# Patient Record
Sex: Male | Born: 1970 | Race: White | Hispanic: No | Marital: Married | Smoking: Former smoker
Health system: Southern US, Community
[De-identification: ages and names within clinical notes are randomized; demographics above are authoritative.]

## PROBLEM LIST (undated history)

## (undated) DIAGNOSIS — C61 Malignant neoplasm of prostate: Secondary | ICD-10-CM

## (undated) DIAGNOSIS — Z800 Family history of malignant neoplasm of digestive organs: Secondary | ICD-10-CM

## (undated) HISTORY — DX: Family history of malignant neoplasm of digestive organs: Z80.0

---

## 1999-03-26 ENCOUNTER — Emergency Department (HOSPITAL_COMMUNITY): Admission: EM | Admit: 1999-03-26 | Discharge: 1999-03-26 | Payer: Self-pay | Admitting: Emergency Medicine

## 2016-05-23 DIAGNOSIS — J069 Acute upper respiratory infection, unspecified: Secondary | ICD-10-CM | POA: Diagnosis not present

## 2016-09-23 DIAGNOSIS — Z8349 Family history of other endocrine, nutritional and metabolic diseases: Secondary | ICD-10-CM | POA: Diagnosis not present

## 2016-09-23 DIAGNOSIS — Z0000 Encounter for general adult medical examination without abnormal findings: Secondary | ICD-10-CM | POA: Diagnosis not present

## 2016-09-23 DIAGNOSIS — Z13220 Encounter for screening for lipoid disorders: Secondary | ICD-10-CM | POA: Diagnosis not present

## 2016-09-23 DIAGNOSIS — Z131 Encounter for screening for diabetes mellitus: Secondary | ICD-10-CM | POA: Diagnosis not present

## 2017-09-28 DIAGNOSIS — Z0000 Encounter for general adult medical examination without abnormal findings: Secondary | ICD-10-CM | POA: Diagnosis not present

## 2017-09-28 DIAGNOSIS — E785 Hyperlipidemia, unspecified: Secondary | ICD-10-CM | POA: Diagnosis not present

## 2017-09-28 DIAGNOSIS — Z23 Encounter for immunization: Secondary | ICD-10-CM | POA: Diagnosis not present

## 2018-01-05 DIAGNOSIS — R309 Painful micturition, unspecified: Secondary | ICD-10-CM | POA: Diagnosis not present

## 2018-01-05 DIAGNOSIS — Z125 Encounter for screening for malignant neoplasm of prostate: Secondary | ICD-10-CM | POA: Diagnosis not present

## 2018-01-10 DIAGNOSIS — R972 Elevated prostate specific antigen [PSA]: Secondary | ICD-10-CM | POA: Diagnosis not present

## 2018-02-01 DIAGNOSIS — R972 Elevated prostate specific antigen [PSA]: Secondary | ICD-10-CM | POA: Diagnosis not present

## 2018-02-01 HISTORY — PX: TRANSRECTAL ULTRASOUND: SHX5146

## 2018-02-01 HISTORY — PX: PROSTATE BIOPSY: SHX241

## 2018-02-08 DIAGNOSIS — C61 Malignant neoplasm of prostate: Secondary | ICD-10-CM | POA: Diagnosis not present

## 2018-02-09 ENCOUNTER — Other Ambulatory Visit (HOSPITAL_COMMUNITY): Payer: Self-pay | Admitting: Urology

## 2018-02-09 DIAGNOSIS — C61 Malignant neoplasm of prostate: Secondary | ICD-10-CM

## 2018-02-23 ENCOUNTER — Encounter (HOSPITAL_COMMUNITY)
Admission: RE | Admit: 2018-02-23 | Discharge: 2018-02-23 | Disposition: A | Discharge status: Home / Self Care | Payer: BLUE CROSS/BLUE SHIELD | Source: Ambulatory Visit | Attending: Urology | Admitting: Urology

## 2018-02-23 DIAGNOSIS — C61 Malignant neoplasm of prostate: Secondary | ICD-10-CM

## 2018-02-23 DIAGNOSIS — N200 Calculus of kidney: Secondary | ICD-10-CM | POA: Diagnosis not present

## 2018-02-23 MED ORDER — TECHNETIUM TC 99M MEDRONATE IV KIT
21.2000 | PACK | Freq: Once | INTRAVENOUS | Status: AC | PRN
  Administered 2018-02-23: 21.2 via INTRAVENOUS

## 2018-03-01 ENCOUNTER — Telehealth: Payer: Self-pay | Admitting: Genetic Counselor

## 2018-03-01 ENCOUNTER — Encounter: Payer: Self-pay | Admitting: Genetic Counselor

## 2018-03-01 DIAGNOSIS — C61 Malignant neoplasm of prostate: Secondary | ICD-10-CM | POA: Diagnosis not present

## 2018-03-01 DIAGNOSIS — Z5111 Encounter for antineoplastic chemotherapy: Secondary | ICD-10-CM | POA: Diagnosis not present

## 2018-03-01 NOTE — Telephone Encounter (Signed)
Appt has been scheduled for the pt to see Santiago Glad for genetic counseling on 5/15 at 2pm. Letter mailed to the pt.

## 2018-03-06 ENCOUNTER — Telehealth: Payer: Self-pay | Admitting: Medical Oncology

## 2018-03-06 NOTE — Telephone Encounter (Signed)
I called pt to introduce myself as the Prostate Nurse Navigator and the Coordinator of the Prostate [place].  1. I confirmed with the patient he is aware of his referral to the clinic 03/14/18 arriving at 12:30pm.   2. I discussed the format of the clinic and the physicians he will be seeing that day.   3. I discussed where the clinic is located and how to contact me.  4. I confirmed his address and informed him I would be mailing a packet of information and forms to be completed. I asked him to bring them with him the day of his appointment.   He voiced understanding of the above. I asked him to call me if he has any questions or concerns regarding his appointments or the forms he needs to complete.

## 2018-03-07 ENCOUNTER — Encounter: Payer: Self-pay | Admitting: Medical Oncology

## 2018-03-13 ENCOUNTER — Encounter: Payer: Self-pay | Admitting: Medical Oncology

## 2018-03-13 ENCOUNTER — Encounter: Payer: Self-pay | Admitting: Radiation Oncology

## 2018-03-13 ENCOUNTER — Telehealth: Payer: Self-pay | Admitting: Medical Oncology

## 2018-03-13 DIAGNOSIS — C61 Malignant neoplasm of prostate: Secondary | ICD-10-CM | POA: Insufficient documentation

## 2018-03-13 NOTE — Telephone Encounter (Signed)
Spoke with Frank [name] to confirm appointment for [facility] arriving at 12:30pm. I reviewed location, registration, valet parking and asked him to have lunch before arrival. I also reminded him to bring his completed medical forms. He voiced understanding.

## 2018-03-13 NOTE — Progress Notes (Signed)
GU Location of Tumor / Histology: Adenocarcinoma of the prostate  If Prostate Cancer, Gleason Score is (5 + 5) and PSA is (27.88)  Frank [name] presented about 2 months ago with signs/symptoms of:  Bilateral firmness of the prostate examined by urologist Dr. Karsten Ro.  Biopsies:    Past/Anticipated interventions by urology, if any:  02/01/2018: Transrectal Ultrasound and prostate biopsy 02/23/2018: CT Abdomen and pelvis without contrast  Past/Anticipated interventions by medical oncology, if any: no  Weight changes, if any: none  Bowel/Bladder complaints, if any: Nocturia x 2-3, voiding easier since Firmagon, dribbling urine   Nausea/Vomiting, if any: no  Pain issues, if any:  yes, low back  SAFETY ISSUES:  Prior radiation? no  Pacemaker/ICD? no  Possible current pregnancy? No  Is the patient on methotrexate? no  Current Complaints / other details:  47 year old male. Married with two children.

## 2018-03-14 ENCOUNTER — Encounter: Payer: Self-pay | Admitting: Medical Oncology

## 2018-03-14 ENCOUNTER — Encounter: Payer: Self-pay | Admitting: General Practice

## 2018-03-14 ENCOUNTER — Ambulatory Visit
Admission: RE | Admit: 2018-03-14 | Discharge: 2018-03-14 | Disposition: A | Discharge status: Home / Self Care | Payer: BLUE CROSS/BLUE SHIELD | Source: Ambulatory Visit | Attending: Radiation Oncology | Admitting: Radiation Oncology

## 2018-03-14 ENCOUNTER — Other Ambulatory Visit: Payer: Self-pay

## 2018-03-14 ENCOUNTER — Telehealth: Payer: Self-pay | Admitting: Pharmacist

## 2018-03-14 ENCOUNTER — Telehealth: Payer: Self-pay | Admitting: Pharmacy Technician

## 2018-03-14 ENCOUNTER — Encounter: Payer: Self-pay | Admitting: Radiation Oncology

## 2018-03-14 ENCOUNTER — Inpatient Hospital Stay: Payer: BLUE CROSS/BLUE SHIELD | Attending: Oncology | Admitting: Oncology

## 2018-03-14 VITALS — BP 133/79 | HR 90 | Temp 97.7°F | Resp 16 | Ht 69.0 in | Wt 179.8 lb

## 2018-03-14 DIAGNOSIS — C786 Secondary malignant neoplasm of retroperitoneum and peritoneum: Secondary | ICD-10-CM

## 2018-03-14 DIAGNOSIS — Z87891 Personal history of nicotine dependence: Secondary | ICD-10-CM | POA: Insufficient documentation

## 2018-03-14 DIAGNOSIS — C7951 Secondary malignant neoplasm of bone: Secondary | ICD-10-CM | POA: Diagnosis not present

## 2018-03-14 DIAGNOSIS — C61 Malignant neoplasm of prostate: Secondary | ICD-10-CM | POA: Insufficient documentation

## 2018-03-14 DIAGNOSIS — Z79899 Other long term (current) drug therapy: Secondary | ICD-10-CM | POA: Diagnosis not present

## 2018-03-14 DIAGNOSIS — Z191 Hormone sensitive malignancy status: Secondary | ICD-10-CM | POA: Diagnosis not present

## 2018-03-14 DIAGNOSIS — Z7952 Long term (current) use of systemic steroids: Secondary | ICD-10-CM | POA: Diagnosis not present

## 2018-03-14 DIAGNOSIS — R972 Elevated prostate specific antigen [PSA]: Secondary | ICD-10-CM | POA: Diagnosis not present

## 2018-03-14 HISTORY — DX: Malignant neoplasm of prostate: C61

## 2018-03-14 MED ORDER — PREDNISONE 5 MG PO TABS: 5.0000 mg | ORAL_TABLET | Freq: Every day | ORAL | 90 refills | Status: DC

## 2018-03-14 MED ORDER — ABIRATERONE ACETATE 250 MG PO TABS: 1000.0000 mg | ORAL_TABLET | Freq: Every day | ORAL | 0 refills | Status: DC

## 2018-03-14 NOTE — Telephone Encounter (Signed)
Oral Oncology Patient Advocate Encounter  Received notification from [facility] that prior authorization for Frank [name] is required.  PA submitted on CoverMyMeds Key [id number] Status is pending  Oral Oncology Clinic will continue to follow.  Frank [name]. Melynda Keller, [place] Patient [place] [phone] 03/14/2018 2:39 PM

## 2018-03-14 NOTE — Progress Notes (Signed)
                               Care Plan Summary  Name: [name] DOB: 09/20/1971   Your Medical Team:   Urologist -  Dr. Raynelle Bring, [facility]  Radiation Oncologist - Dr. Tyler Pita, [facility]   Medical Oncologist - Dr. [name], [place]  Recommendations: 1) Continue androgen deprivation (hormone injections) 2) Zytiga (abiraterone)  3) Genetics  * These recommendations are based on information available as of today's consult.      Recommendations may change depending on the results of further tests or exams.  Next Steps: 1) Androgen deprivation (hormone injections) with Dr. Karsten Ro  2) Fabio Asa (abiraterone) Jessie-oral chemo pharmacist will be in touch with you regarding insurance and delivery of drug 3) Genetic appointment 04/05/18 at [facility]  When appointments need to be scheduled, you will be contacted by [facility] and/or [facility].  Patient provided with business cards for all team members and a copy of "[place] Prevention Safety Sheet".  Zytiga drug information given to patient.  Questions?  Please do not hesitate to call Cira Rue, RN, BSN, OCN at [phone] any questions or concerns.  Shirlean Mylar is your Oncology Nurse Navigator and is available to assist you while you're receiving your medical care at [facility].

## 2018-03-14 NOTE — Telephone Encounter (Signed)
Oral Oncology Patient Advocate Encounter  Prior Authorization for Frank [name] has been approved.    Effective dates: 03/14/2018 through 03/13/2019  Oral Oncology Clinic will continue to follow.   Frank [name]. Melynda Keller, [place] Patient [place] [phone] 03/14/2018 2:55 PM

## 2018-03-14 NOTE — Telephone Encounter (Signed)
Oral Oncology Pharmacist Encounter  Received new prescription for Zytiga (abiraterone) for the treatment of newly diagnosed, advanced, possibly metastatic, hormone-sensitive prostate cancer in conjunction with prednisone and androgen deprivation therapy, planned duration until disease progression or unacceptable toxicity.  Per MD note, patient started on Firmagon 2 weeks ago. [facility] [address]  No labs in Epic to assess, wil be checked at next office visit.  Current medication list in Epic reviewed, no DDIs with Zytiga identified.  Prescription will be sent to appropriate specialty pharmacy once insurance authorization is obtained. [facility] is currently out of network for patient's prescription insurance coverage.  Oral Oncology Clinic will continue to follow for insurance authorization, copayment issues, initial counseling and start date.  Johny Drilling, PharmD, BCPS, BCOP 03/14/2018 2:36 PM Oral Oncology Clinic [phone]

## 2018-03-14 NOTE — Progress Notes (Signed)
Reason for Referral: Prostate cancer.  HPI: 47 year old gentleman currently of [place] where he lived the majority of his life.  He is a rather healthy gentleman without any significant comorbid conditions started developing lower urinary tract symptoms starting with incomplete bladder emptying and subsequently hematuria.  He was evaluated by his primary care physician initially and his PSA was 27.88.  Based on that finding he was referred to Dr. Karsten Ro at [facility] urology.  Biopsy obtained at that time of his prostate showed high-grade and high volume disease in his prostate with a Gleason score 5+ 5 = in one core and 5+4 = 9 in the remaining 11 cores.  Based on these findings he underwent staging CT scan including CT scan of the abdomen and pelvis which showed abdominal and pelvic adenopathy anterior to the aorta on the periaortic lymph node as well as the iliac chains as well as a bulky left-sided pelvic adenopathy.  His bone scan did show questionable T10 and left rib involvement.  Based on these findings, he was started on Firmagon by Dr. Karsten Ro 2 weeks ago.  After the start of androgen deprivation, his symptoms improved from a urination standpoint including complete emptying.  He denies any nocturia or dysuria.  He denies any constitutional symptoms of weight loss or appetite changes.  Continues to be active and attends to activities of daily living.  He does not report any headaches, blurry vision, syncope or seizures. Does not report any fevers, chills or sweats.  Does not report any cough, wheezing or hemoptysis.  Does not report any chest pain, palpitation, orthopnea or leg edema.  Does not report any nausea, vomiting or abdominal pain.  Does not report any constipation or diarrhea.  Does not report any skeletal complaints.    Does not report frequency, urgency or hematuria.  Does not report any skin rashes or lesions. Does not report any heat or cold intolerance.  Does not report any  lymphadenopathy or petechiae.  Does not report any anxiety or depression.  Remaining review of systems is negative.    No past medical history on file.:  Past Surgical History:  Procedure Laterality Date  . PROSTATE BIOPSY  02/01/2018  . TRANSRECTAL ULTRASOUND  02/01/2018  :   Current Outpatient Medications:  .  abiraterone acetate (ZYTIGA) 250 MG tablet, Take 4 tablets (1,000 mg total) by mouth daily. Take on an empty stomach 1 hour before or 2 hours after a meal, Disp: 120 tablet, Rfl: 0 .  predniSONE (DELTASONE) 5 MG tablet, Take 1 tablet (5 mg total) by mouth daily with breakfast., Disp: 30 tablet, Rfl: 90:  Not on File:  Family History  Problem Relation Age of Onset  . Cancer Neg Hx   :  Social History   Socioeconomic History  . Marital status: Married    Spouse name: Bryson Ha  . Number of children: 2  . Years of education: Not on file  . Highest education level: Not on file  Occupational History  . Not on file  Social Needs  . Financial resource strain: Not on file  . Food insecurity:    Worry: Not on file    Inability: Not on file  . Transportation needs:    Medical: Not on file    Non-medical: Not on file  Tobacco Use  . Smoking status: Former Smoker    Last attempt to quit: 1996    Years since quitting: 23.3  Substance and Sexual Activity  . Alcohol use: Not on file  .  Drug use: Not on file  . Sexual activity: Not on file  Lifestyle  . Physical activity:    Days per week: Not on file    Minutes per session: Not on file  . Stress: Not on file  Relationships  . Social connections:    Talks on phone: Not on file    Gets together: Not on file    Attends religious service: Not on file    Active member of club or organization: Not on file    Attends meetings of clubs or organizations: Not on file    Relationship status: Not on file  . Intimate partner violence:    Fear of current or ex partner: Not on file    Emotionally abused: Not on file     Physically abused: Not on file    Forced sexual activity: Not on file  Other Topics Concern  . Not on file  Social History Narrative   Married with 2 children. Self-employed.  :   Exam: ECOG 0 General appearance: alert and cooperative appeared without distress. Head: atraumatic without any abnormalities. Eyes: conjunctivae/corneas clear. PERRL.  Sclera anicteric. Throat: lips, mucosa, and tongue normal; without oral thrush or ulcers. Resp: clear to auscultation bilaterally without rhonchi, wheezes or dullness to percussion. Cardio: regular rate and rhythm, S1, S2 normal, no murmur, click, rub or gallop GI: soft, non-tender; bowel sounds normal; no masses,  no organomegaly Skin: Skin color, texture, turgor normal. No rashes or lesions Lymph nodes: Cervical, supraclavicular, and axillary nodes normal. Neurologic: Grossly normal without any motor, sensory or deep tendon reflexes. Musculoskeletal: No joint deformity or effusion.     Nm Bone Scan Whole Body  Result Date: 02/23/2018 CLINICAL DATA:  Prostate cancer.  Elevated PSA equal 27 EXAM: NUCLEAR MEDICINE WHOLE BODY BONE SCAN TECHNIQUE: Whole body anterior and posterior images were obtained approximately 3 hours after intravenous injection of radiopharmaceutical. RADIOPHARMACEUTICALS:  21.2 mCi Technetium-55m MDP IV COMPARISON:  None. FINDINGS: Long segment (approximately 3 cm) of activity associated with the posterior LEFT eighth rib is in a pattern typical of metastatic disease. Focal lesion at T10 posteriorly is also in a pattern suggestive of metastasis. Probable neural arch on the LEFT at T10. Indeterminate lesion at the skull vertex IMPRESSION: 1. Two skeletal lesions most consistent with prostate cancer metastasis within the LEFT eighth rib and T10 vertebral body. 2. Indeterminate lesion at skull vertex. Electronically Signed   By: Suzy Bouchard M.D.   On: 02/23/2018 17:10    Assessment and Plan:   47 year old man with the  following issues:  1.  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  His Gleason score is 5+5 = 10 and a PSA of 27.88.  He has documented disease with pelvic and retroperitoneal adenopathy and no visceral metastasis.  His bone scan showed a very limited disease potentially in an T10 and left eighth rib.  The natural course of this disease and treatment options were discussed today in detail.  He understands he has an incurable malignancy but a disease that can be palliated and treated effectively for an extended period of time.  The backbone of treating this disease is long-term androgen deprivation therapy which she is already started.  Long-term complication associated with this therapy was discussed and he will likely switch to Lupron in the near future.  The role of adding additional therapy in the form of androgen synthesis inhibitors or chemotherapy for hormone sensitive prostate cancer was discussed today in detail.  The studies that led to the approval of these drugs including improvement in overall survival as well as progression free survival was reviewed.  The rationale and the side effects associated with these treatments were reviewed today.  Complication associated with Zytiga were discussed which include nausea, fatigue, hypertension, edema, hypokalemia, adrenal insufficiency among others were reviewed.  Complication associated with chemotherapy which includes nausea, fatigue, myelosuppression and neutropenic sepsis among others.  After discussion today, we have elected to proceed with Zytiga 1000 mg daily and prednisone 5 mg daily in the immediate future.  2.  Androgen depravation: He will continue to receive that indefinitely and currently on Firmagon received at [facility].  3. Bone directed therapy: He has very limited bone disease but would be a good candidate for Xgeva or Zometa in the future.  This will continue to be addressed with him in the  future.  4.  Lower urinary tract symptoms: Improved with androgen deprivation therapy.  No additional therapy is needed at this time for local disease control.  Radiation therapy can be an option for that in the future.  5.  Prognosis: This was discussed in details today.  He has an incurable malignancy but a disease that can be palliated for an extended period of time and given his health and performance status and aggressive therapy is warranted.  6.  Genetic counseling: Referral to genetics has been made and appointments already set up.  7.  Follow-up: We will be in the next 4-6 weeks to follow his progress.  60  minutes was spent with the patient face-to-face today.  More than 50% of time was dedicated to patient counseling, education and coordination of the his multifaceted care.

## 2018-03-14 NOTE — Progress Notes (Signed)
Radiation Oncology         ([phone]) [phone] ________________________________  Multidisciplinary Prostate Cancer Clinic  Initial Radiation Oncology Consultation  Name: Frank [name] MRN: [id number]  Date: 03/14/2018  DOB: 07-20-1971  CC:[name], MD  Raynelle Bring, MD   REFERRING PHYSICIAN: Raynelle Bring, MD  DIAGNOSIS: 47 y.o. gentleman with stage T2c N1 M1 adenocarcinoma of the prostate with a Gleason's score of 5+5 and a PSA of 27.88 - Stage IV    ICD-10-CM   1. Malignant neoplasm of prostate ([place]) C61     HISTORY OF PRESENT ILLNESS::Frank [name] is a 47 y.o. gentleman.  He was noted to have an elevated PSA of 27.88 when seen by his primary care physician, Dr. Orland Mustard on 01/05/18 for worsening LUTS and hematuria.  Accordingly, he was referred for evaluation in urology to Dr. Karsten Ro, where a digital rectal examination was performed at that time revealing firmness bilaterally.  The patient proceeded to transrectal ultrasound with 12 biopsies of the prostate on 02/01/18.  The prostate volume measured 53 cc.  Out of 12 core biopsies, 12 were positive.  The maximum Gleason score was 5+5, and this was seen in the left apex, while the remainder of the 11 samples showed Gleason's 5+4 with extraprostatic extension and perineural invasion present.   CT Pelvis on 02/23/18 showed extensive adenopathy from the pelvis to the upper abdomen with neither lung nor bone metastases.    Bone scan 02/23/18 shows two possible bone mets at T10 and left 8th rib.  The patient reviewed the biopsy results with his urologist and he has kindly been referred today to the multidisciplinary prostate cancer clinic for presentation of pathology and radiology studies in our conference for discussion of potential radiation treatment options and clinical evaluation.   On review of systems, the patient reports urinating has improved since his Firmagon injection on 03/01/2018. He reports fatigue. He reports lower  back pain but not in the T10 location.   PREVIOUS RADIATION THERAPY: No  PAST MEDICAL HISTORY:  has no past medical history on file.    PAST SURGICAL HISTORY: Past Surgical History:  Procedure Laterality Date  . PROSTATE BIOPSY  02/01/2018  . TRANSRECTAL ULTRASOUND  02/01/2018    FAMILY HISTORY: family history is not on file.  SOCIAL HISTORY:  reports that he quit smoking about 23 years ago. He does not have any smokeless tobacco history on file.  ALLERGIES: Patient has no allergy information on record.  MEDICATIONS:  No current outpatient medications on file.   No current facility-administered medications for this encounter.     REVIEW OF SYSTEMS:  A 15 point review of systems is documented in the electronic medical record. This was obtained by the nursing staff. However, I reviewed this with the patient to discuss relevant findings and make appropriate changes.  Pertinent items noted in HPI and remainder of comprehensive ROS otherwise negative.  The patient completed an IPSS and IIEF questionnaire.  His IPSS score was 10-11 indicating moderate urinary outflow obstructive symptoms of frequency, urgency, and nocturia x2-3.  He indicated that his erectile function is able to complete sexual activity most times.   PHYSICAL EXAM: This patient is in no acute distress.  He is alert and oriented.   vitals were not taken for this visit.  He exhibits no respiratory distress or labored breathing.  He appears neurologically intact.  His mood is pleasant.  His affect is appropriate.  Please note the digital rectal exam findings described above.  KPS = 90  100 - Normal; no complaints; no evidence of disease. 90   - Able to carry on normal activity; minor signs or symptoms of disease. 80   - Normal activity with effort; some signs or symptoms of disease. 73   - Cares for self; unable to carry on normal activity or to do active work. 60   - Requires occasional assistance, but is able to care for  most of his personal needs. 50   - Requires considerable assistance and frequent medical care. 15   - Disabled; requires special care and assistance. 6   - Severely disabled; hospital admission is indicated although death not imminent. 75   - Very sick; hospital admission necessary; active supportive treatment necessary. 10   - Moribund; fatal processes progressing rapidly. 0     - Dead  Karnofsky DA, Abelmann WH, Craver LS and Burchenal JH [phone]) The use of the nitrogen mustards in the palliative treatment of carcinoma: with particular reference to bronchogenic carcinoma Cancer 1 634-56   LABORATORY DATA:  No results found for: WBC, HGB, HCT, MCV, PLT No results found for: NA, K, CL, CO2 No results found for: ALT, AST, GGT, ALKPHOS, BILITOT   RADIOGRAPHY: Nm Bone Scan Whole Body  Result Date: 02/23/2018 CLINICAL DATA:  Prostate cancer.  Elevated PSA equal 27 EXAM: NUCLEAR MEDICINE WHOLE BODY BONE SCAN TECHNIQUE: Whole body anterior and posterior images were obtained approximately 3 hours after intravenous injection of radiopharmaceutical. RADIOPHARMACEUTICALS:  21.2 mCi Technetium-33m MDP IV COMPARISON:  None. FINDINGS: Long segment (approximately 3 cm) of activity associated with the posterior LEFT eighth rib is in a pattern typical of metastatic disease. Focal lesion at T10 posteriorly is also in a pattern suggestive of metastasis. Probable neural arch on the LEFT at T10. Indeterminate lesion at the skull vertex IMPRESSION: 1. Two skeletal lesions most consistent with prostate cancer metastasis within the LEFT eighth rib and T10 vertebral body. 2. Indeterminate lesion at skull vertex. Electronically Signed   By: Suzy Bouchard M.D.   On: 02/23/2018 17:10      IMPRESSION: This gentleman is a 47 y.o. gentleman with stage T2c N1 M1 adenocarcinoma of the prostate with a Gleason's score of 5+5 and a PSA of 27.88 - Stage IV.  His primary treatment at this point is likely to be systemic in nature.   However, there may be some benefit associated with treatment of the prostate.   PLAN: I talked to the patient about the role of radiation treatment in the management of prostate cancer. It can be used to palliate pain from bone metastases and alleviate urinary symptoms from prostate involvement. At this time, I do not see a role for radiation treatment and will follow up as needed.   I spent time face to face with the patient and more than 50% of that time was spent in counseling and/or coordination of care.   ------------------------------------------------  Sheral Apley. Tammi Klippel, M.D.  This document serves as a record of services personally performed by Tyler Pita, MD. It was created on his behalf by Rae Lips, a trained medical scribe. The creation of this record is based on the scribe's personal observations and the provider's statements to them. This document has been checked and approved by the attending provider.

## 2018-03-14 NOTE — Telephone Encounter (Signed)
Oral Oncology Pharmacist Encounter  [facility]iga prescription E scribed to [facility] (phone: [phone]). [facility] outpatient pharmacy is not in network for patient's prescription insurance coverage.  We will plan to see the patient today in the office after multidisciplinary prostate clinic. We will provide an update on dispensing pharmacy and offer for initial counseling on Zytiga at that time.  Oral Oncology Clinic will continue to follow.  Johny Drilling, PharmD, BCPS, BCOP 03/14/2018 2:43 PM Oral Oncology Clinic [phone]

## 2018-03-15 ENCOUNTER — Telehealth: Payer: Self-pay | Admitting: Oncology

## 2018-03-15 ENCOUNTER — Telehealth: Payer: Self-pay

## 2018-03-15 NOTE — Telephone Encounter (Signed)
Per 4/23 no los 

## 2018-03-15 NOTE — Telephone Encounter (Signed)
Oral Chemotherapy Pharmacist Encounter   I spoke with patient on 03/14/18 for overview of: Zytiga.   Pt is doing well. Counseled patient on administration, dosing, side effects, monitoring, drug-food interactions, safe handling, storage, and disposal.  Patient will take Zytiga 250mg  tablets, 4 tablets (1000mg ) by mouth once daily on an empty stomach, 1 hour before or 2 hours after a meal.  Patient states he will take his Zyitga first thing in the morning and will wait at least 1 hour before eating.  Patient will take prednisone 5mg  tablet, 1 tablet by mouth one daily with breakfast. Patient understands that prednisone will be started the same day he starts his Zytiga.  Patient informed [facility] [address] He will go ahead and pick that up so that he has it is in anticipation of delivery of Zytiga.  Zytiga start date: TBD, pending medication acquisition  Side effects include but not limited to: peripheral edema, GI upset, hypertension, hot flashes, fatigue, and arthralgias.    Reviewed with patient importance of keeping a medication schedule and plan for any missed doses.  Mr. Popelka voiced understanding and appreciation.   Medication reconciliation performed and medication/allergy list updated.  Patient updated about [facility] as dispensing pharmacy, and provided phone number: [phone] Patient will contact the pharmacy on Thursday, 03/16/2018 if he has not heard from them Patient also provided phone number to oral oncology clinic  Patient with multiple questions about androgen deprivation therapy injection options and all other oral options for adjunctive treatment. All questions answered  Patient knows to call the office with questions or concerns.  Oral Oncology Clinic will continue to follow.  Thank you,  Johny Drilling, PharmD, BCPS, BCOP 03/15/2018 8:19 AM Oral Oncology Clinic [phone]

## 2018-03-15 NOTE — Telephone Encounter (Signed)
Called pt re appts that were added - spoke w/ pt re appts that were added.

## 2018-03-16 ENCOUNTER — Encounter: Payer: Self-pay | Admitting: General Practice

## 2018-03-16 NOTE — Progress Notes (Signed)
[place] Psychosocial Distress Screening Spiritual Care  Met with Frank [name] and wife Frank [name] in [facility] to introduce [place] team/resources, reviewing distress screen per protocol.  The patient scored a 9 on the Psychosocial Distress Thermometer which indicates severe distress. Also assessed for distress and other psychosocial needs.   ONCBCN DISTRESS SCREENING 03/16/2018  Screening Type Initial Screening  Distress experienced in past week (1-10) 9  Practical problem type Work/school  Emotional problem type Depression;Nervousness/Anxiety;Adjusting to illness  Spiritual/Religous concerns type Facing my mortality  Information Concerns Type Lack of info about diagnosis;Lack of info about treatment  Physical Problem type Sleep/insomnia;Changes in urination  Referral to support programs Yes   Mr Repetto describes himself as "very competitive" and likely to poise himself as "wanting to beat this thing"--which is very hard in the face of an incurable dx. Couple has two sons (7, completing first year in college; 57, at home, has ASD). Another recent trauma has been the very sudden death of their beloved dog, who was a big source of comfort for pt.  Talked in detail with couple about challenges, common feelings, and support resources. They found normalization of feelings very helpful. Encouraged them to attend Prostate Cancer Support Group to build community. They are very familiar with [facility] following support for sons after their cousin died unexpectedly in his teens; they will consider utilizing it again for support in processing serious illness.   Follow up needed: Yes.  Couple welcomes f/u support and knows that [facility] is available in person and by phone. Plan to send handwritten note of encouragement and follow by phone/in person when they are on campus, but please also page if immediate needs arise or circumstances change. Thank you.   [place],  [place], [facility] Pager [phone] Voicemail [phone]

## 2018-03-16 NOTE — Progress Notes (Signed)
[place] Spiritual Care Note  Mailed Derold and wife Bryson Ha f/u handwritten note of encouragement and reminder of ongoing availability for support.   [place], [place], [facility] Pager [phone] Voicemail [phone]

## 2018-03-17 NOTE — Addendum Note (Signed)
Encounter addended by: Tyler Pita, MD on: 03/17/2018 5:55 PM  Actions taken: LOS modified

## 2018-03-21 ENCOUNTER — Encounter: Payer: Self-pay | Admitting: Medical Oncology

## 2018-03-21 ENCOUNTER — Telehealth: Payer: Self-pay | Admitting: Medical Oncology

## 2018-03-21 NOTE — Progress Notes (Signed)
Spoke with Emily-pharmacist to discuss patient's concerns with the cost of Zytiga. She will do some research and reach out to patient.

## 2018-03-21 NOTE — Telephone Encounter (Signed)
Oral Oncology Patient Advocate Encounter  Received communication from [name] that Frank [name] initial fill of Frank [name] was shipped to his home on 03/20/2018.   Frank [name]. Melynda Keller, [place] Patient [place] [phone] 03/21/2018 8:34 AM

## 2018-03-21 NOTE — Telephone Encounter (Signed)
Spoke with patient to follow up post [facility]. He states that he is doing well but has concerns about the cost of Zytiga. He spoke with his drug company and his will receive co=payment assistance for the first two months and then he will have to pay $5300. If he can take the generic it will cost $1500.00. He is asking if there are any other programs to help him with cost. I will as  the oral chemo pharmacist to contact him. He voiced his appreciation in this matter.

## 2018-03-22 ENCOUNTER — Telehealth: Payer: Self-pay | Admitting: Pharmacy Technician

## 2018-03-22 NOTE — Telephone Encounter (Signed)
Oral Oncology Patient Advocate Encounter  Received a call from Cira Rue that the patient had contacted her with some concerns about the copayments for his Zytiga.    [facility] has been able to dispense the medication with assistance from a manufacturer copay card.  The copay card reduces the copay to $10 for a 65-month supply.  The patient's copay for branded Zytiga (without the copay card) is in excess of $5300.00 monthly.  This copay card has an annual maximum benefit of $12,000.  Given this limit, the copay card will only provide assistance for 2 months of therapy.   Generic Zytiga (abiraterone) is available to be dispensed, but also carries a high copayment of over $1500 monthly.  There are no copay cards available for generic products.    There are no grant foundations that provide copay assistance to NON-medicare patients.    I have contacted [employer] and requested their assistance in obtaining deductible information and formulary tiering structure for this patient's prescription drug plan.  A deductible requirement may be the reason for the higher than average commercial copayments.    I have left a message for the patient to discuss these details.  I will continue to follow up with any resolution.   [name], [place] Patient [place] [phone] 03/22/2018 1:31 PM

## 2018-03-31 DIAGNOSIS — C61 Malignant neoplasm of prostate: Secondary | ICD-10-CM | POA: Diagnosis not present

## 2018-03-31 DIAGNOSIS — Z5111 Encounter for antineoplastic chemotherapy: Secondary | ICD-10-CM | POA: Diagnosis not present

## 2018-04-05 ENCOUNTER — Other Ambulatory Visit: Payer: Self-pay | Admitting: *Deleted

## 2018-04-05 ENCOUNTER — Inpatient Hospital Stay: Payer: BLUE CROSS/BLUE SHIELD | Attending: Oncology | Admitting: Genetic Counselor

## 2018-04-05 ENCOUNTER — Encounter: Payer: Self-pay | Admitting: Genetic Counselor

## 2018-04-05 ENCOUNTER — Inpatient Hospital Stay: Payer: BLUE CROSS/BLUE SHIELD

## 2018-04-05 DIAGNOSIS — C61 Malignant neoplasm of prostate: Secondary | ICD-10-CM

## 2018-04-05 DIAGNOSIS — Z7183 Encounter for nonprocreative genetic counseling: Secondary | ICD-10-CM | POA: Diagnosis not present

## 2018-04-05 DIAGNOSIS — C7951 Secondary malignant neoplasm of bone: Secondary | ICD-10-CM | POA: Insufficient documentation

## 2018-04-05 DIAGNOSIS — Z800 Family history of malignant neoplasm of digestive organs: Secondary | ICD-10-CM | POA: Diagnosis not present

## 2018-04-05 DIAGNOSIS — R351 Nocturia: Secondary | ICD-10-CM | POA: Insufficient documentation

## 2018-04-05 DIAGNOSIS — G4700 Insomnia, unspecified: Secondary | ICD-10-CM | POA: Insufficient documentation

## 2018-04-05 NOTE — Progress Notes (Signed)
REFERRING PROVIDER: Wyatt Portela, MD [address] [place], [place] [address]  PRIMARY PROVIDER:  [name], MD  PRIMARY REASON FOR VISIT:  1. Malignant neoplasm of prostate ([place])   2. Family history of colon cancer      HISTORY OF PRESENT ILLNESS:   Frank [name], a 47 y.o. male, was seen for a [facility] cancer genetics consultation at the request of Dr. Alen Blew due to a personal and family history of cancer.  Frank [name] presents to clinic today to discuss the possibility of a hereditary predisposition to cancer, genetic testing, and to further clarify his future cancer risks, as well as potential cancer risks for family members.   In March 2019, at the age of 4, Frank [name] was diagnosed with prostate cancer. This is being treated with Zytiga and other oral medications.  The patient's Gleason Score is 5+5-10.  Further evaluation found that he has a lesion on his rib.    CANCER HISTORY:   No history exists.    Past Medical History:  Diagnosis Date  . Family history of colon cancer   . Prostate cancer [facility])     Past Surgical History:  Procedure Laterality Date  . PROSTATE BIOPSY  02/01/2018  . TRANSRECTAL ULTRASOUND  02/01/2018    Social History   Socioeconomic History  . Marital status: Married    Spouse name: Bryson Ha  . Number of children: 2  . Years of education: Not on file  . Highest education level: Not on file  Occupational History    Employer: [employer]    Comment: owns a Biomedical engineer business  Social Needs  . Financial resource strain: Not on file  . Food insecurity:    Worry: Not on file    Inability: Not on file  . Transportation needs:    Medical: Not on file    Non-medical: Not on file  Tobacco Use  . Smoking status: Former Smoker    Packs/day: 0.25    Years: 12.00    Pack years: 3.00    Types: Cigarettes    Last attempt to quit: 1996    Years since quitting: 23.3  . Smokeless tobacco: Never Used   Substance and Sexual Activity  . Alcohol use: Never    Frequency: Never  . Drug use: Never  . Sexual activity: Yes  Lifestyle  . Physical activity:    Days per week: Not on file    Minutes per session: Not on file  . Stress: Not on file  Relationships  . Social connections:    Talks on phone: Not on file    Gets together: Not on file    Attends religious service: Not on file    Active member of club or organization: Not on file    Attends meetings of clubs or organizations: Not on file    Relationship status: Not on file  Other Topics Concern  . Not on file  Social History Narrative   Married with 2 children. Self-employed.      FAMILY HISTORY:  We obtained a detailed, 4-generation family history.  Significant diagnoses are listed below: Family History  Problem Relation Age of Onset  . Colon cancer Father 7  . Hemochromatosis Father   . Liver cancer Paternal Aunt   . Hemochromatosis Paternal Aunt   . Heart attack Maternal Grandmother   . Cirrhosis Maternal Grandfather   . Dementia Paternal Grandmother   . Heart attack Paternal Grandfather   .  Hemochromatosis Paternal Grandfather   . Cancer Neg Hx    The patient has a full sister and paternal half sister who are both cancer free.  He has two sons who are cancer free.  The patient's parents are both living.    The patient's mother is 37 and does not have cancer.  She was an only child.  Her parents are deceased from non cancer related issues.    The patient's father had colon cancer at 54 and ha been diagnosed with hemochromatosis.  He has four sisters and two brothers.  One sister had hemochromatosis and developed liver cancer.  His parents are deceased.  His father had hemochromatosis.  Frank [name] is unaware of previous family history of genetic testing for hereditary cancer risks. Patient's ancestors are of [place] descent. There is no reported Ashkenazi Jewish ancestry. There is no known  consanguinity.  GENETIC COUNSELING ASSESSMENT: Frank [name] is a 47 y.o. male with a personal and family history of cancer which is somewhat suggestive of a hereditary cancer syndrome and predisposition to cancer. We, therefore, discussed and recommended the following at today's visit.   DISCUSSION: We discussed that about 15-20% of prostate cancer is hereditary with most cases due to BRCA mutations.  Other genes are associated with an increased risk for prostate cancer as well.  Some of these genes, if positive, could change systemic therapy.  This includes PARP inhibitors for positive BRCA, or BRCA pathway genes, as well as potential immunotherapy with [name] syndrome.  We reviewed the characteristics, features and inheritance patterns of hereditary cancer syndromes. We also discussed genetic testing, including the appropriate family members to test, the process of testing, insurance coverage and turn-around-time for results. We discussed the implications of a negative, positive and/or variant of uncertain significant result. We recommended Frank [name] pursue genetic testing for the common hereditary cancer gene panel. APC, ATM, AXIN2, BARD1, BMPR1A, BRCA1, BRCA2, BRIP1, CDH1, CDK4, CDKN2A, CHEK2, DICER1, EPCAM, GREM1, KIT, MEN1, MLH1, MSH2, MSH6, MUTYH, NBN, NF1, PALB2, PDGFRA, PMS2, POLD1, POLE, PTEN, RAD50, RAD51C, RAD51D, SDHA, SDHB, SDHC, SDHD, SMAD4, SMARCA4. STK11, TP53, TSC1, TSC2, and VHL.     Based on Frank [name] personal and family history of cancer, he meets medical criteria for genetic testing. Despite that he meets criteria, he may still have an out of pocket cost. We discussed that if his out of pocket cost for testing is over $100, the laboratory will call and confirm whether he wants to proceed with testing.  If the out of pocket cost of testing is less than $100 he will be billed by the genetic testing laboratory.   PLAN: After considering the risks, benefits, and limitations,  Frank [name]  provided informed consent to pursue genetic testing and the blood sample was sent to [facility] for analysis of the common hereditary cancer panel. Results should be available within approximately 2-3 weeks' time, at which point they will be disclosed by telephone to Frank [name], as will any additional recommendations warranted by these results. Frank [name] will receive a summary of his genetic counseling visit and a copy of his results once available. This information will also be available in Epic. We encouraged Frank [name] to remain in contact with cancer genetics annually so that we can continuously update the family history and inform him of any changes in cancer genetics and testing that may be of benefit for his family. Frank [name] questions were answered to his satisfaction today. Our contact information was provided  should additional questions or concerns arise.  Lastly, we encouraged Frank [name] to remain in contact with cancer genetics annually so that we can continuously update the family history and inform him of any changes in cancer genetics and testing that may be of benefit for this family.   Mr.  [name] questions were answered to his satisfaction today. Our contact information was provided should additional questions or concerns arise. Thank you for the referral and allowing [place] to share in the care of your patient.   Karen P. Florene Glen, [place], [facility] Certified Genetic Counselor [email or web address] phone: [phone]  The patient was seen for a total of 45 minutes in face-to-face genetic counseling.  This patient was discussed with Drs. Magrinat, Lindi Adie and/or Burr Medico who agrees with the above.    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

## 2018-04-10 ENCOUNTER — Other Ambulatory Visit: Payer: Self-pay | Admitting: *Deleted

## 2018-04-10 DIAGNOSIS — C61 Malignant neoplasm of prostate: Secondary | ICD-10-CM

## 2018-04-10 MED ORDER — ABIRATERONE ACETATE 250 MG PO TABS: 1000.0000 mg | ORAL_TABLET | Freq: Every day | ORAL | 0 refills | Status: DC

## 2018-04-14 NOTE — Telephone Encounter (Signed)
Oral Oncology Patient Advocate Encounter  Spoke with Mr. Kronberg today about the copayment concerns for Zytiga.    We will begin the process to apply for manufacturer assistance from [place] and [place].  We have made arrangements to meet on Tuesday 04/18/18 when he is here for his appointment with Dr. Alen Blew.  He is bringing his income documents with him.    [name], [facility] Patient [place] [phone] 04/14/2018 2:46 PM

## 2018-04-18 ENCOUNTER — Telehealth: Payer: Self-pay | Admitting: Oncology

## 2018-04-18 ENCOUNTER — Inpatient Hospital Stay: Payer: BLUE CROSS/BLUE SHIELD

## 2018-04-18 ENCOUNTER — Inpatient Hospital Stay (HOSPITAL_BASED_OUTPATIENT_CLINIC_OR_DEPARTMENT_OTHER): Payer: BLUE CROSS/BLUE SHIELD | Admitting: Oncology

## 2018-04-18 VITALS — BP 138/84 | HR 83 | Temp 98.2°F | Resp 18 | Ht 69.0 in | Wt 184.4 lb

## 2018-04-18 DIAGNOSIS — R351 Nocturia: Secondary | ICD-10-CM

## 2018-04-18 DIAGNOSIS — C7951 Secondary malignant neoplasm of bone: Secondary | ICD-10-CM

## 2018-04-18 DIAGNOSIS — C61 Malignant neoplasm of prostate: Secondary | ICD-10-CM | POA: Diagnosis not present

## 2018-04-18 DIAGNOSIS — G4700 Insomnia, unspecified: Secondary | ICD-10-CM | POA: Diagnosis not present

## 2018-04-18 LAB — CMP (CANCER CENTER ONLY)
ALBUMIN: 4.4 g/dL (ref 3.5–5.0)
ALT: 30 U/L (ref 0–55)
ANION GAP: 10 (ref 3–11)
AST: 21 U/L (ref 5–34)
Alkaline Phosphatase: 87 U/L (ref 40–150)
BUN: 14 mg/dL (ref 7–26)
CHLORIDE: 107 mmol/L (ref 98–109)
CO2: 24 mmol/L (ref 22–29)
Calcium: 9.4 mg/dL (ref 8.4–10.4)
Creatinine: 0.89 mg/dL (ref 0.70–1.30)
GFR, Estimated: >60 mL/min (ref >60)
Glucose, Bld: 112 mg/dL (ref 70–140)
POTASSIUM: 3.8 mmol/L (ref 3.5–5.1)
Sodium: 141 mmol/L (ref 136–145)
Total Bilirubin: 0.3 mg/dL (ref 0.2–1.2)
Total Protein: 7.6 g/dL (ref 6.4–8.3)

## 2018-04-18 LAB — CBC WITH DIFFERENTIAL (CANCER CENTER ONLY)
BASOS PCT: 1 %
Basophils Absolute: 0 10*3/uL (ref 0.0–0.1)
Eosinophils Absolute: 0.1 10*3/uL (ref 0.0–0.5)
Eosinophils Relative: 2 %
HEMATOCRIT: 40.9 % (ref 38.4–49.9)
HEMOGLOBIN: 13.8 g/dL (ref 13.0–17.1)
LYMPHS ABS: 1.3 10*3/uL (ref 0.9–3.3)
LYMPHS PCT: 20 %
MCH: 29.8 pg (ref 27.2–33.4)
MCHC: 33.7 g/dL (ref 32.0–36.0)
MCV: 88.3 fL (ref 79.3–98.0)
Monocytes Absolute: 0.3 10*3/uL (ref 0.1–0.9)
Monocytes Relative: 5 %
NEUTROS ABS: 4.9 10*3/uL (ref 1.5–6.5)
NEUTROS PCT: 72 %
Platelet Count: 234 10*3/uL (ref 140–400)
RBC: 4.64 MIL/uL (ref 4.20–5.82)
RDW: 13.1 % (ref 11.0–14.6)
WBC Count: 6.8 10*3/uL (ref 4.0–10.3)

## 2018-04-18 MED ORDER — CALCIUM CARBONATE 600 MG PO TABS: 600.0000 mg | ORAL_TABLET | Freq: Two times a day (BID) | ORAL | 3 refills | Status: DC

## 2018-04-18 NOTE — Progress Notes (Signed)
Hematology and Oncology Follow Up Visit  Frank [name] [id number] [birth year] 47 y.o. 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  His Gleason score is 5+5 = 10 and PSA of 27.88 diagnosed March 2019.   Prior Therapy:  He is status post prostate biopsy March 2019 which confirmed the diagnosis of Gleason score 5+5 = 10 prostate cancer.  Current therapy:  Androgen deprivation started with [place] under the care of Dr. Jeffie Pollock.  Zytiga 1000 mg prednisone 5 mg daily in April 2019.  Interim History: Frank [name] presents today for a follow-up visit.  Since last visit, he started taking Zytiga without any major complications.  He was able to get the first month of therapy with very limited co-pay although his co-pay moving forward will be rather excessive.  He denies any nausea, edema or excessive fatigue.  He does report mild fatigue that resolved spontaneously.  He denies any bone pain or pathological fractures.  He continues to perform activities of daily living without any decline in ability to do so.  He denies any urination issues including incontinence or dysuria.  He does report insomnia related to nocturia.  He does not report any headaches, blurry vision, syncope or seizures. Does not report any fevers, chills or sweats.  Does not report any cough, wheezing or hemoptysis.  Does not report any chest pain, palpitation, orthopnea or leg edema.  Does not report any nausea, vomiting or abdominal pain.  Does not report any constipation or diarrhea.  Does not report any skeletal complaints.    Does not report frequency, urgency or hematuria.  Does not report any skin rashes or lesions. Does not report any heat or cold intolerance.  Does not report any lymphadenopathy or petechiae.  Does not report any anxiety or depression.  Remaining review of  systems is negative.    Medications: I have reviewed the patient's current medications.  Current Outpatient Medications  Medication Sig Dispense Refill  . abiraterone acetate (ZYTIGA) 250 MG tablet Take 4 tablets (1,000 mg total) by mouth daily. Take on an empty stomach 1 hour before or 2 hours after a meal 120 tablet 0  . lansoprazole (PREVACID) 15 MG capsule Take 15 mg by mouth daily at 12 noon.    . predniSONE (DELTASONE) 5 MG tablet Take 1 tablet (5 mg total) by mouth daily with breakfast. (Patient not taking: Reported on 03/14/2018) 30 tablet 90  . tadalafil (CIALIS) 10 MG tablet Take 10 mg by mouth daily.  99  . valACYclovir (VALTREX) 500 MG tablet Take 500 mg by mouth 2 (two) times daily.     No current facility-administered medications for this visit.      Allergies: No Known Allergies  Past Medical History, Surgical history, Social history, and Family History were reviewed and updated.    Physical Exam:  Blood pressure 138/84, pulse 83, temperature 98.2 F (36.8 C), temperature source Oral, resp. rate 18, height 5\' 9"  (1.753 m), weight 184 lb 6.4 oz (83.6 kg), SpO2 99 %.   ECOG: 0 General appearance: alert and cooperative appeared without distress. Head: Normocephalic, without obvious abnormality Oropharynx: No oral thrush or ulcers. Eyes: No scleral icterus.  Pupils are equal and round reactive to light. Lymph nodes: Cervical, supraclavicular, and axillary nodes normal. Heart:regular rate and rhythm, S1, S2 normal, no murmur, click, rub or gallop Lung:chest clear, no wheezing, rales, normal symmetric air  entry Abdomin: soft, non-tender, without masses or organomegaly. Neurological: No motor, sensory deficits.  Intact deep tendon reflexes. Skin: No rashes or lesions.  No ecchymosis or petechiae. Musculoskeletal: No joint deformity or effusion. Psychiatric: Mood and affect are appropriate.    Impression and Plan:   47 year old man with the:  1.   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    He is currently on Zytiga and prednisone started in April 2019.  He has no complications related to this medication at this time.  Risks and benefits of continuing this treatment was reviewed today and is agreeable to continue.  His PSA is currently pending and I anticipate improvement after starting androgen deprivation and Zytiga.  2.  Androgen depravation: Risks and benefits of continuing this therapy indefinitely was reviewed today.  He is currently receiving Firmagon under the care of Dr. Jeffie Pollock.  Anticipate switching to Lupron in the future.  3. Bone directed therapy: He is at risk of developing osteoporosis in addition to pathological bone fractures.  His risk is low however given his limited bone disease.  We have discussed the strategies to improve that including calcium and vitamin D supplements.  We also reviewed Xgeva and Prolia as a potential options in the future.  4.  Lower urinary tract symptoms: Symptoms have improved at this time except for nocturia.  This is contributing to some mild insomnia.  We have discussed strategies to improve that including decreasing his fluid intake at nighttime.  5.  Prognosis: His disease is incurable but can be palliated for extended period of time.  He has excellent performance status and aggressive therapy is warranted.  6.  Genetic counseling: Genetic counseling has been completed.  Laboratory testing are pending at this point.  7.  Follow-up: We will be in 6 weeks.  25 minutes was spent with the patient face-to-face today.  More than 50% of time was dedicated to patient counseling, education and coordination of the his multifaceted care.     [name], MD 5/28/20193:42 PM

## 2018-04-18 NOTE — Telephone Encounter (Signed)
Appointments scheduled AVS/Calendar printed per 5/28 los °

## 2018-04-19 ENCOUNTER — Telehealth: Payer: Self-pay | Admitting: *Deleted

## 2018-04-19 LAB — PROSTATE-SPECIFIC AG, SERUM (LABCORP)

## 2018-04-19 NOTE — Telephone Encounter (Signed)
As noted below by Dr. Shadad, I informed patient of his PSA level. He verbalized understanding.  

## 2018-04-19 NOTE — Telephone Encounter (Signed)
-----   Message from Wyatt Portela, MD sent at 04/19/2018  7:52 AM EDT ----- Please let him know his PSA is very low.

## 2018-04-20 ENCOUNTER — Telehealth: Payer: Self-pay | Admitting: Genetic Counselor

## 2018-04-20 ENCOUNTER — Ambulatory Visit: Payer: Self-pay | Admitting: Genetic Counselor

## 2018-04-20 ENCOUNTER — Encounter: Payer: Self-pay | Admitting: Genetic Counselor

## 2018-04-20 DIAGNOSIS — C61 Malignant neoplasm of prostate: Secondary | ICD-10-CM

## 2018-04-20 DIAGNOSIS — Z1379 Encounter for other screening for genetic and chromosomal anomalies: Secondary | ICD-10-CM | POA: Insufficient documentation

## 2018-04-20 DIAGNOSIS — Z800 Family history of malignant neoplasm of digestive organs: Secondary | ICD-10-CM

## 2018-04-20 NOTE — Telephone Encounter (Signed)
Revealed negative genetic testing.  Discussed that we do not know why he has prostate cancer or why there is cancer in the family. It could be due to a different gene that we are not testing, or maybe our current technology may not be able to pick something up.  It will be important for him to keep in contact with genetics to keep up with whether additional testing may be needed.  Explained that there was an ATM VUS.  This is still a normal result.   We would not change his medical management.  We will recontact him once this is reclassified.

## 2018-04-20 NOTE — Progress Notes (Addendum)
HPI:  Frank [name] was previously seen in the [facility] clinic due to a personal and family history of cancer and concerns regarding a hereditary predisposition to cancer. Please refer to our prior cancer genetics clinic note for more information regarding Frank [name] medical, social and family histories, and our assessment and recommendations, at the time. Frank [name] recent genetic test results were disclosed to him, as were recommendations warranted by these results. These results and recommendations are discussed in more detail below.  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  The Hereditary Gene Panel offered by [facility] includes sequencing and/or deletion duplication testing of the following 47 genes: APC, ATM, AXIN2, BARD1, BMPR1A, BRCA1, BRCA2, BRIP1, CDH1, CDK4, CDKN2A (p14ARF), CDKN2A (p16INK4a), CHEK2, CTNNA1, DICER1, EPCAM (Deletion/duplication testing only), GREM1 (promoter region deletion/duplication testing only), KIT, MEN1, MLH1, MSH2, MSH3, MSH6, MUTYH, NBN, NF1, NHTL1, PALB2, PDGFRA, PMS2, POLD1, POLE, PTEN, RAD50, RAD51C, RAD51D, SDHB, SDHC, SDHD, SMAD4, SMARCA4. STK11, TP53, TSC1, TSC2, and VHL.  The following genes were evaluated for sequence changes only: SDHA and HOXB13 c.251G>A variant only. The report date is Apr 20, 2018.       FAMILY HISTORY:  We obtained a detailed, 4-generation family history.  Significant diagnoses are listed below: Family History  Problem Relation Age of Onset  . Colon cancer Father 45  . Hemochromatosis Father   . Liver cancer Paternal Aunt   . Hemochromatosis Paternal Aunt   . Heart attack Maternal Grandmother   . Cirrhosis Maternal Grandfather   . Dementia Paternal Grandmother   . Heart attack Paternal Grandfather   .  Hemochromatosis Paternal Grandfather   . Cancer Neg Hx     The patient has a full sister and paternal half sister who are both cancer free.  He has two sons who are cancer free.  The patient's parents are both living.    The patient's mother is 5 and does not have cancer.  She was an only child.  Her parents are deceased from non cancer related issues.    The patient's father had colon cancer at 44 and ha been diagnosed with hemochromatosis.  He has four sisters and two brothers.  One sister had hemochromatosis and developed liver cancer.  His parents are deceased.  His father had hemochromatosis.  Frank [name] is unaware of previous family history of genetic testing for hereditary cancer risks. Patient's ancestors are of [place] descent. There is no reported Ashkenazi Jewish ancestry. There is no known consanguinity.  GENETIC TEST RESULTS: Genetic testing reported out on Apr 20, 2018 through the common hereditary cancer panel found no deleterious mutations.  The Hereditary Gene Panel offered by [facility] includes sequencing and/or deletion duplication testing of the following 47 genes: APC, ATM, AXIN2, BARD1, BMPR1A, BRCA1, BRCA2, BRIP1, CDH1, CDK4, CDKN2A (p14ARF), CDKN2A (p16INK4a), CHEK2, CTNNA1, DICER1, EPCAM (Deletion/duplication testing only), GREM1 (promoter region deletion/duplication testing only), KIT, MEN1, MLH1, MSH2, MSH3, MSH6, MUTYH, NBN, NF1, NHTL1, PALB2, PDGFRA, PMS2, POLD1, POLE, PTEN, RAD50, RAD51C, RAD51D, SDHB, SDHC, SDHD, SMAD4, SMARCA4. STK11, TP53, TSC1, TSC2, and VHL.  The following genes were evaluated for sequence changes only: SDHA and HOXB13 c.251G>A variant only. The test report has been scanned into EPIC and is located under the Molecular Pathology section of the Results Review  tab.    We discussed with Frank [name] that since the current genetic testing is not perfect, it is possible there may be a gene mutation in one of these genes that current testing cannot  detect, but that chance is small.  We also discussed, that it is possible that another gene that has not yet been discovered, or that we have not yet tested, is responsible for the cancer diagnoses in the family, and it is, therefore, important to remain in touch with cancer genetics in the future so that we can continue to offer Frank [name] the most up to date genetic testing.   Genetic testing did detect a Variant of Unknown Significance in the ATM gene called c.4909+3G>A (Intronic). This is a variant in the Intronic region. At this time, it is unknown if this variant is associated with increased cancer risk or if this is a normal finding, but most variants such as this get reclassified to being inconsequential. It should not be used to make medical management decisions. With time, we suspect the lab will determine the significance of this variant, if any. If we do learn more about it, we will try to contact Frank [name] to discuss it further. However, it is important to stay in touch with [place] periodically and keep the address and phone number up to date.  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  The updated report date is December 17, 2020   CANCER SCREENING RECOMMENDATIONS: This result is reassuring and indicates that Frank [name] likely does not have an increased risk for a future cancer due to a mutation in one of these genes. This normal test also suggests that Frank [name] cancer was most likely not due to an inherited predisposition associated with one of these genes.  Most cancers happen by chance and this negative test suggests that his cancer falls into this category.  We, therefore, recommended he continue to follow the cancer management and screening guidelines provided by his oncology and primary healthcare provider.   An individual's cancer risk and medical management are not determined by genetic test results alone. Overall cancer risk assessment  incorporates additional factors, including personal medical history, family history, and any available genetic information that may result in a personalized plan for cancer prevention and surveillance.  RECOMMENDATIONS FOR FAMILY MEMBERS:  Women in this family might be at some increased risk of developing cancer, over the general population risk, simply due to the family history of cancer.  We recommended women in this family have a yearly mammogram beginning at age 34, or 89 years younger than the earliest onset of cancer, an annual clinical breast exam, and perform monthly breast self-exams. Women in this family should also have a gynecological exam as recommended by their primary provider. All family members should have a colonoscopy by age 59.  FOLLOW-UP: Lastly, we discussed with Frank [name] that cancer genetics is a rapidly advancing field and it is possible that new genetic tests will be appropriate for him and/or his family members in the future. We encouraged him to remain in contact with cancer genetics on an annual basis so we can update his personal and family histories and let him know of advances in cancer genetics that may benefit this family.   Our contact number was provided. Frank [name] questions were answered to his satisfaction, and he knows he is welcome to call us at anytime with additional questions or concerns.   Roma Kayser, MS, [place]  Certified [employer] [place] [email or web address]

## 2018-04-27 ENCOUNTER — Telehealth: Payer: Self-pay | Admitting: Pharmacy Technician

## 2018-04-27 NOTE — Telephone Encounter (Signed)
Oral Oncology Patient Advocate Encounter  Met patient in [facility] to complete application for [employer] Patient Assistance Foundation in an effort to reduce patient's out of pocket expense for Zytiga to $0.    Application completed and faxed to [phone].   [employer] patient assistance phone number for follow up is [phone].   His copayments, up until this point, have been supported by a copay card from the manufacturer.  Since his out of pocket costs before the copay card is applied are high ($5300 monthly) the annual benefit of the copay care are nearing exhaustion.  I have included a letter of medical necessity with the application that explains his situation.   This encounter will be updated until final determination.  Emily M. Howes CPhT, AAS Specialty Pharmacy Patient Advocate Oral Oncology Clinic - [facility] [phone] 04/27/2018 11:35 AM  

## 2018-05-02 DIAGNOSIS — R3912 Poor urinary stream: Secondary | ICD-10-CM | POA: Diagnosis not present

## 2018-05-02 DIAGNOSIS — C7951 Secondary malignant neoplasm of bone: Secondary | ICD-10-CM | POA: Diagnosis not present

## 2018-05-02 DIAGNOSIS — C61 Malignant neoplasm of prostate: Secondary | ICD-10-CM | POA: Diagnosis not present

## 2018-05-08 NOTE — Telephone Encounter (Signed)
Oral Oncology Pharmacist Encounter  Received call from patient with questions about status of [employer] application.  I called [employer] at [phone] for status check. Representative related information that current tax information is required for continued processing of patient's application. When application was submitted by the office it was submitted with income documentation from 2017.  I called patient with request for this information. Patient states he will get in touch with his account today and provide requested income documentation.  Once this information is received from patient it will be faxed to [place] [employer] for continued processing of patient's application.  Patient states he has 8 days of Zytiga remaining. We discussed that if he is to end up with a break in therapy that he should stop his prednisone during his treatment break from his Zytiga as well. Both agents will be restarted at the same time once we get continued supply of Zytiga to patient.   Oral Oncology Clinic will continue to follow.  Johny Drilling, PharmD, BCPS, BCOP  05/08/2018 9:52 AM Oral Oncology Clinic [phone]

## 2018-05-09 NOTE — Telephone Encounter (Signed)
Oral Oncology Pharmacist Encounter  Received W-2's from patient for himself and his wife for 2018. These updated income docs have been faxed to [place] at [phone]. We will follow up with [employer] later today to ensure application continues to be processed. Patient has 7 days of Zytiga remaining on hand.  Oral Oncology Clinic will continue to follow.  Johny Drilling, PharmD, BCPS, BCOP  05/09/2018 7:59 AM Oral Oncology Clinic [phone]

## 2018-05-09 NOTE — Telephone Encounter (Signed)
Oral Oncology Patient Advocate Encounter  Followed up with [employer] to make sure that documents sent yesterday had been received.  I was informed that they also needed a "form [phone]" that shows that an extension had been filed for the filing of the 2018 tax return.    [name] is going to contact his accountant and have that form emailed to him.  He will then forward to the oral chemo office.   [name], [name] Patient [place] [phone] 05/09/2018 3:11 PM

## 2018-05-10 ENCOUNTER — Other Ambulatory Visit: Payer: Self-pay | Admitting: *Deleted

## 2018-05-10 DIAGNOSIS — C61 Malignant neoplasm of prostate: Secondary | ICD-10-CM

## 2018-05-10 MED ORDER — ABIRATERONE ACETATE 250 MG PO TABS: 1000.0000 mg | ORAL_TABLET | Freq: Every day | ORAL | 0 refills | Status: DC

## 2018-05-11 NOTE — Telephone Encounter (Signed)
Oral Oncology Patient Advocate Encounter  Form [id number] was sent to [employer] on 05/10/2018 along with the household W2s and updated income amount on the application.    I have confirmed with [employer] that all of the information was received and the application is currently in the final reviewal process.    I will continue to follow up.   [name], [place] Patient [place] [phone] 05/11/2018 10:02 AM

## 2018-05-12 NOTE — Telephone Encounter (Signed)
Oral Oncology Patient Advocate Encounter  Continued follow up on the patient's application for assistance.  Application remains pending.  I requested, again, that the application be expedited.    [name], [place] Patient [place] [phone] 05/12/2018 9:43 AM]

## 2018-05-16 NOTE — Telephone Encounter (Signed)
Oral Oncology Pharmacist Encounter  Received notification from Lina Sar that patient has been temporarily enrolled into their compassionate use program to receive Zytiga at no out-of-pocket cost to patient through 07/14/18. Full enrollment until 11/21/18 is contingent upon receiving Medicare part D attestation form, signed, back from the patient.  I called [employer] to speak with them abut how patient had a commercial prescription coverage plan. They show patient's plan is a Medicare Plan. I do not have a copy of patient's actual insurance card to reference.  I called patient and spoke with patient to inform him about enrollment into program. Mr Azure states that he has not yet heard from [place] [employer] with this information. A letter was mailed to the patient on 05/15/18.  I explained that he will receive Zytiga from [place] of program's choosing, [facility] (ph: [phone]) and provided dispensing pharmacy's number to patient.  They will process prescription through patient's insurance coverage and then the compassionate use program will pick up any copayment that is due. Mr. Munger informed that enrollment ends on 11/21/2018. Mr. Borgerding informed that full enrollment up to this date is contingent upon [employer] receiving back Medicare part D attestation form.  Mr. Carreno will keep an eye out in the mail over the next week for this information and return to program as soon as it is received.  He will contact me back in the office week of 05/22/2018 if he has not yet received this information from program. We will arrange for patient to come to the office to sign copy that was provided to [place] so that I may fax to [place] [employer] if they have not received it.  Mr. Spickler provided phone number to [place] PAF [phone]) so that he may contact program if she has further questions about how to obtain Zytiga.  Mr. Aris Everts may end up with a small break (feww days - 1 week) in [facility] therapy. We talked  through any implications of this.  Mr. Waymire expressed understanding and appreciation. He knows to call the office with any additional questions or concerns.  Johny Drilling, PharmD, BCPS, BCOP  05/16/2018 12:15 PM Oral Oncology Clinic [phone]

## 2018-05-30 ENCOUNTER — Encounter: Payer: Self-pay | Admitting: Medical Oncology

## 2018-05-30 ENCOUNTER — Inpatient Hospital Stay: Payer: BLUE CROSS/BLUE SHIELD | Attending: Oncology

## 2018-05-30 ENCOUNTER — Telehealth: Payer: Self-pay

## 2018-05-30 ENCOUNTER — Inpatient Hospital Stay (HOSPITAL_BASED_OUTPATIENT_CLINIC_OR_DEPARTMENT_OTHER): Payer: BLUE CROSS/BLUE SHIELD | Admitting: Oncology

## 2018-05-30 VITALS — BP 119/67 | HR 77 | Temp 97.9°F | Resp 17 | Ht 69.0 in | Wt 187.8 lb

## 2018-05-30 DIAGNOSIS — C61 Malignant neoplasm of prostate: Secondary | ICD-10-CM

## 2018-05-30 DIAGNOSIS — C7951 Secondary malignant neoplasm of bone: Secondary | ICD-10-CM | POA: Insufficient documentation

## 2018-05-30 LAB — CBC WITH DIFFERENTIAL (CANCER CENTER ONLY)
BASOS PCT: 1 %
Basophils Absolute: 0 10*3/uL (ref 0.0–0.1)
EOS ABS: 0.2 10*3/uL (ref 0.0–0.5)
Eosinophils Relative: 3 %
HEMATOCRIT: 39.8 % (ref 38.4–49.9)
Hemoglobin: 13.6 g/dL (ref 13.0–17.1)
Lymphocytes Relative: 20 %
Lymphs Abs: 1.4 10*3/uL (ref 0.9–3.3)
MCH: 30.6 pg (ref 27.2–33.4)
MCHC: 34.2 g/dL (ref 32.0–36.0)
MCV: 89.4 fL (ref 79.3–98.0)
MONO ABS: 0.4 10*3/uL (ref 0.1–0.9)
MONOS PCT: 5 %
Neutro Abs: 5.2 10*3/uL (ref 1.5–6.5)
Neutrophils Relative %: 71 %
Platelet Count: 187 10*3/uL (ref 140–400)
RBC: 4.45 MIL/uL (ref 4.20–5.82)
RDW: 13.1 % (ref 11.0–14.6)
WBC Count: 7.2 10*3/uL (ref 4.0–10.3)

## 2018-05-30 LAB — CMP (CANCER CENTER ONLY)
ALBUMIN: 4.3 g/dL (ref 3.5–5.0)
ALT: 20 U/L (ref 0–44)
AST: 15 U/L (ref 15–41)
Alkaline Phosphatase: 86 U/L (ref 38–126)
Anion gap: 6 (ref 5–15)
BILIRUBIN TOTAL: 0.4 mg/dL (ref 0.3–1.2)
BUN: 12 mg/dL (ref 6–20)
CO2: 28 mmol/L (ref 22–32)
Calcium: 9.6 mg/dL (ref 8.9–10.3)
Chloride: 109 mmol/L (ref 98–111)
Creatinine: 0.81 mg/dL (ref 0.61–1.24)
GFR, Est AFR Am: >60 mL/min (ref >60)
GFR, Estimated: >60 mL/min (ref >60)
GLUCOSE: 109 mg/dL — AB (ref 70–99)
Potassium: 3.7 mmol/L (ref 3.5–5.1)
SODIUM: 143 mmol/L (ref 135–145)
Total Protein: 6.9 g/dL (ref 6.5–8.1)

## 2018-05-30 NOTE — Progress Notes (Signed)
Patient states he is beginning to adjust to side effects of Zytiga and Lupron. He has been fatigued and experiencing hot flashes but states the fatigue has improved slightly. He is interested in attending the prostate support group. I gave him the August meeting date.

## 2018-05-30 NOTE — Telephone Encounter (Signed)
Per patient requested date and time for return visit. Per 7/9 los printed avs and calender of upcoming appointment.

## 2018-05-30 NOTE — Progress Notes (Signed)
Hematology and Oncology Follow Up Visit  Frank [name] [id number] [birth year] 47 y.o. 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  He presented with with disease in the bone as well as lymphadenopathy and found to have Gleason score is 5+5 = 10 and PSA of 27.88.    Prior Therapy:  He is status post prostate biopsy March 2019 which confirmed the diagnosis of Gleason score 5+5 = 10 prostate cancer.  Current therapy:  Androgen deprivation currently receiving Lupron under the care of Dr. Jeffie Pollock.  Zytiga 1000 mg prednisone 5 mg daily in April 2019.  Interim History: Frank [name] is here for a follow-up visit.  Since last visit, he reports no major changes in his health.  He does report grade 1 fatigue and hot flashes associated with Lupron.  He also reported to decrease in his libido as well.  He denies any complications related to [facility] including abdominal pain, edema or excessive fatigue.  He is able to receive this medication without any issues.  He denies any bone pain or back pain.  He denies any pathological fractures.  He does not report any headaches, blurry vision, syncope or seizures.  He denies any alteration in mental status or confusion.  Does not report any fevers, chills or sweats.    No changes appetite or weight loss.  Does not report any cough, wheezing or hemoptysis.  Does not report any chest pain, palpitation, orthopnea or leg edema.  Does not report any nausea, vomiting or abdominal pain.  Does not report any hematochezia or melena. Does not report any arthralgias or myalgias.    Does not report frequency, urgency or hematuria.  Does not report any skin rashes or lesions. Does not report any heat or cold intolerance.  Does not report any lymphadenopathy or petechiae.  Does not report any changes in his mood.  He denies any bleeding tendencies.  Remaining review  of systems is negative.    Medications: I have reviewed the patient's current medications.  Current Outpatient Medications  Medication Sig Dispense Refill  . abiraterone acetate (ZYTIGA) 250 MG tablet Take 4 tablets (1,000 mg total) by mouth daily. Take on an empty stomach 1 hour before or 2 hours after a meal 120 tablet 0  . calcium carbonate (OS-CAL) 600 MG TABS tablet Take 1 tablet (600 mg total) by mouth 2 (two) times daily with a meal. 60 tablet 3  . lansoprazole (PREVACID) 15 MG capsule Take 15 mg by mouth daily at 12 noon.    . predniSONE (DELTASONE) 5 MG tablet Take 1 tablet (5 mg total) by mouth daily with breakfast. (Patient not taking: Reported on 03/14/2018) 30 tablet 90  . tadalafil (CIALIS) 10 MG tablet Take 10 mg by mouth daily.  99  . valACYclovir (VALTREX) 500 MG tablet Take 500 mg by mouth 2 (two) times daily.     No current facility-administered medications for this visit.      Allergies: No Known Allergies  Past Medical History, Surgical history, Social history, and Family History were reviewed and updated.    Physical Exam:  Blood pressure 119/67, pulse 77, temperature 97.9 F (36.6 C), temperature source Oral, resp. rate 17, height 5\' 9"  (1.753 m), weight 187 lb 12.8 oz (85.2 kg), SpO2 100 %.   ECOG: 0 General appearance: Well-appearing gentleman without distress.. Head: Atraumatic without abnormalities.   Oropharynx: Mucous membranes are moist and pink. Eyes:  Conjunctiva is clear without icterus. Lymph nodes: No lymphadenopathy noted in the cervical, supraclavicular or axillary nodes Heart:regular rate and rhythm, without murmurs.  No leg edema. Lung:chest clear, without any rhonchi, wheezes or dullness to percussion. Abdomin: soft, non-tender, without any shifting dullness or ascites. Neurological: No deficits noted in the motor, sensory deep tendon reflexes. Skin: No ecchymosis or petechiae. Musculoskeletal: No clubbing or cyanosis. Psychiatric:  Appropriate mood and affect.    Impression and Plan:   47 year old man with the:  1.  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  He presented with Gleason score 10 and retroperitoneal adenopathy wiith limited bone disease.    He was started on Zytiga with prednisone without complications.  His PSA continues to be undetectable without any recent issues of obtaining this medication.  The natural course of this disease was reviewed again as well as risks and benefits of continuing this treatment.  After discussion today he is agreeable to continue at this time.  Long-term complications such as hypertension, hypokalemia and adrenal insufficiency.  2.  Androgen depravation: He is currently on Lupron without any major complications.  Long-term side effects associated with this medication were reviewed again today.  These include osteoporosis, hyperlipidemia among others.  He is agreeable to continue understands that is critical to undergo androgen deprivation to treat his condition.  3. Bone directed therapy: The rationale for using Xgeva in this condition was discussed today.  Calcium and vitamin D supplements recommended and he will obtain dental clearance for consideration for Xgeva in the future.  4.  Lower urinary tract symptoms: Continues to improve at this time after the start of therapy.  Mostly have resolved.  5.  Prognosis: Was discussed again today.  He understands he is incurable malignancy but disease that is reasonably palliated.  6.  Follow-up: We will be in 7 weeks.  25 minutes was spent with the patient face-to-face today.  More than 50% of time was dedicated to patient counseling, education and answering questions about diagnosis, prognosis and future plan of care.     Frank [name], Frank [name] 7/9/20193:22 PM

## 2018-05-31 ENCOUNTER — Telehealth: Payer: Self-pay

## 2018-05-31 LAB — PROSTATE-SPECIFIC AG, SERUM (LABCORP): Prostate Specific Ag, Serum: <0.1 ng/mL (ref 0.0–4.0)

## 2018-05-31 NOTE — Telephone Encounter (Signed)
Called pt to let him know that PSA is still <0.1. Pt verbalized understanding.

## 2018-06-08 ENCOUNTER — Other Ambulatory Visit: Payer: Self-pay | Admitting: Oncology

## 2018-06-08 DIAGNOSIS — C61 Malignant neoplasm of prostate: Secondary | ICD-10-CM

## 2018-06-09 ENCOUNTER — Encounter: Payer: Self-pay | Admitting: *Deleted

## 2018-06-09 ENCOUNTER — Other Ambulatory Visit: Payer: Self-pay | Admitting: *Deleted

## 2018-06-09 DIAGNOSIS — C61 Malignant neoplasm of prostate: Secondary | ICD-10-CM

## 2018-06-09 MED ORDER — ABIRATERONE ACETATE 250 MG PO TABS: 1000.0000 mg | ORAL_TABLET | Freq: Every day | ORAL | 11 refills | Status: DC

## 2018-06-13 ENCOUNTER — Other Ambulatory Visit: Payer: Self-pay | Admitting: *Deleted

## 2018-06-13 DIAGNOSIS — C61 Malignant neoplasm of prostate: Secondary | ICD-10-CM

## 2018-06-13 MED ORDER — ABIRATERONE ACETATE 250 MG PO TABS: 1000.0000 mg | ORAL_TABLET | Freq: Every day | ORAL | 0 refills | Status: DC

## 2018-07-04 ENCOUNTER — Other Ambulatory Visit: Payer: Self-pay | Admitting: Oncology

## 2018-07-04 DIAGNOSIS — C61 Malignant neoplasm of prostate: Secondary | ICD-10-CM

## 2018-07-13 ENCOUNTER — Ambulatory Visit: Payer: BLUE CROSS/BLUE SHIELD | Admitting: Oncology

## 2018-07-21 ENCOUNTER — Telehealth: Payer: Self-pay

## 2018-07-21 ENCOUNTER — Inpatient Hospital Stay: Payer: BLUE CROSS/BLUE SHIELD | Attending: Oncology | Admitting: Oncology

## 2018-07-21 ENCOUNTER — Inpatient Hospital Stay: Payer: BLUE CROSS/BLUE SHIELD

## 2018-07-21 VITALS — BP 137/81 | HR 95 | Temp 97.8°F | Resp 18 | Ht 69.0 in | Wt 191.0 lb

## 2018-07-21 DIAGNOSIS — C61 Malignant neoplasm of prostate: Secondary | ICD-10-CM

## 2018-07-21 DIAGNOSIS — C7951 Secondary malignant neoplasm of bone: Secondary | ICD-10-CM

## 2018-07-21 LAB — CBC WITH DIFFERENTIAL (CANCER CENTER ONLY)
Basophils Absolute: 0 10*3/uL (ref 0.0–0.1)
Basophils Relative: 0 %
EOS ABS: 0.1 10*3/uL (ref 0.0–0.5)
EOS PCT: 1 %
HCT: 40.9 % (ref 38.4–49.9)
Hemoglobin: 13.9 g/dL (ref 13.0–17.1)
LYMPHS ABS: 1.2 10*3/uL (ref 0.9–3.3)
LYMPHS PCT: 19 %
MCH: 30.8 pg (ref 27.2–33.4)
MCHC: 34 g/dL (ref 32.0–36.0)
MCV: 90.7 fL (ref 79.3–98.0)
MONO ABS: 0.3 10*3/uL (ref 0.1–0.9)
MONOS PCT: 5 %
Neutro Abs: 4.5 10*3/uL (ref 1.5–6.5)
Neutrophils Relative %: 75 %
PLATELETS: 179 10*3/uL (ref 140–400)
RBC: 4.51 MIL/uL (ref 4.20–5.82)
RDW: 12.4 % (ref 11.0–14.6)
WBC: 6.1 10*3/uL (ref 4.0–10.3)

## 2018-07-21 LAB — CMP (CANCER CENTER ONLY)
ALBUMIN: 4.5 g/dL (ref 3.5–5.0)
ALT: 25 U/L (ref 0–44)
AST: 17 U/L (ref 15–41)
Alkaline Phosphatase: 97 U/L (ref 38–126)
Anion gap: 9 (ref 5–15)
BUN: 15 mg/dL (ref 6–20)
CHLORIDE: 110 mmol/L (ref 98–111)
CO2: 25 mmol/L (ref 22–32)
CREATININE: 0.85 mg/dL (ref 0.61–1.24)
Calcium: 9.8 mg/dL (ref 8.9–10.3)
GFR, Est AFR Am: >60 mL/min (ref >60)
GLUCOSE: 110 mg/dL — AB (ref 70–99)
Potassium: 4.1 mmol/L (ref 3.5–5.1)
Sodium: 144 mmol/L (ref 135–145)
Total Bilirubin: 0.4 mg/dL (ref 0.3–1.2)
Total Protein: 7.5 g/dL (ref 6.5–8.1)

## 2018-07-21 NOTE — Telephone Encounter (Signed)
Printed avs and calender of upcoming appointment. Per 8/30 los 

## 2018-07-21 NOTE — Progress Notes (Signed)
Hematology and Oncology Follow Up Visit  Frank [name] [id number] [birth year] 47 y.o. 07/21/2018 2:23 PM Frank [name], MDMorrow, Frank Lies, MD   Principle Diagnosis: 47 year old man with prostate cancer with Gleason score is 5+5 = 10 and PSA of 27.88.  He developed castration-sensitive disease to the bone as well as lymphadenopathy and found to have .    Prior Therapy:  He is status post prostate biopsy March 2019 which confirmed the diagnosis of Gleason score 5+5 = 10 prostate cancer.  Current therapy:  Androgen deprivation currently receiving Lupron under the care of Dr. Jeffie [name].  Zytiga 1000 mg prednisone 5 mg daily in April 2019.  Interim History: [name] is here for a follow-up visit.  Since the last visit, he reports no major changes in his health.  He remains active and continues to attend activities of daily living.  He tolerated Zytiga reasonably well with some mild fatigue that is manageable.  He does report some hot flashes but no bone pain or pathological fractures.  He denies any urination difficulties or recent hospitalizations.  He does not report any headaches, blurry vision, syncope or seizures.  He denies any dizziness or confusion.  Does not report any fevers, chills or sweats.    Appetite and weight remains stable.  Does not report any cough, wheezing or hemoptysis.  Does not report any chest pain, palpitation, orthopnea or leg edema.  Does not report any nausea, vomiting or abdominal pain.  He denies any change in his bowel habits. Does not report any hematochezia or melena. Does not report any bone pain or pathological fractures.    Does not report frequency, urgency or hematuria.  Does not report any skin rashes or lesions. Does not report any heat or cold intolerance.  Does not report any lymphadenopathy or petechiae.  Does not report any anxiety or depression.  He denies any skin rashes or lesions.  Remaining review of systems is negative.    Medications: I have  reviewed the patient's current medications.  Current Outpatient Medications  Medication Sig Dispense Refill  . calcium carbonate (OS-CAL) 600 MG TABS tablet Take 1 tablet (600 mg total) by mouth 2 (two) times daily with a meal. 60 tablet 3  . lansoprazole (PREVACID) 15 MG capsule Take 15 mg by mouth daily at 12 noon.    . predniSONE (DELTASONE) 5 MG tablet Take 1 tablet (5 mg total) by mouth daily with breakfast. (Patient not taking: Reported on 03/14/2018) 30 tablet 90  . tadalafil (CIALIS) 10 MG tablet Take 10 mg by mouth daily.  99  . valACYclovir (VALTREX) 500 MG tablet Take 500 mg by mouth 2 (two) times daily.    Marland Kitchen ZYTIGA 250 MG tablet TAKE 4 TABLETS BY MOUTH ONCE DAILY AS DIRECTED.  TAKE 1 HOUR BEFORE OR 2 HOURS AFTER A MEAL 120 tablet 11   No current facility-administered medications for this visit.      Allergies: No Known Allergies  Past Medical History, Surgical history, Social history, and Family History were reviewed and updated.    Physical Exam:  Blood pressure 137/81, pulse 95, temperature 97.8 F (36.6 C), temperature source Oral, resp. rate 18, height 5\' 9"  (1.753 m), weight 191 lb (86.6 kg), SpO2 99 %.  ECOG: 0   General appearance: Comfortable appearing without any discomfort Head: Normocephalic without any trauma Oropharynx: Mucous membranes are moist and pink without any thrush or ulcers. Eyes: Pupils are equal and round reactive to light. Lymph nodes: No cervical, supraclavicular, inguinal  or axillary lymphadenopathy.   Heart:regular rate and rhythm.  S1 and S2 without leg edema. Lung: Clear without any rhonchi or wheezes.  No dullness to percussion. Abdomin: Soft, nontender, nondistended with good bowel sounds.  No hepatosplenomegaly. Musculoskeletal: No joint deformity or effusion.  Full range of motion noted. Neurological: No deficits noted on motor, sensory and deep tendon reflex exam. Skin: No petechial rash or dryness.  Appeared moist.  Psychiatric:  Mood and affect appeared appropriate.   Results for Frank, [name] (MRN [id number]) as of 07/21/2018 14:42  Ref. Range 04/18/2018 15:34 05/30/2018 14:45  Prostate Specific Ag, Serum Latest Ref Range: 0.0 - 4.0 ng/mL <0.1 <0.1    Impression and Plan:   47 year old man with the:  1.    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     He remains on Zytiga with prednisone without any complications.  His PSA continues to be undetectable.  The natural course of this disease was reviewed today as well as future treatment options were reviewed.  His imaging studies from April 2019 were discussed and treatment plan was outlined.  I recommend continuing Zytiga for the time being and will repeat staging work-up in January 2020.  Different salvage therapy will be needed in the form of Taxotere chemotherapy if he develops progression of disease.  2.  Androgen depravation: He remains on Lupron which he is taking every 6 months.  He is due in December 2019 under the care of Dr. Jeffie [name] at [facility].  3. Bone directed therapy: He remains on calcium and vitamin D.  Risks and benefits of Delton See was reviewed today.  Long-term complications including osteonecrosis of the jaw and hypocalcemia was reviewed.  He has a pending dental evaluation and possible surgery and for this reason we will continue to hold off Xgeva.  4.  Lower urinary tract symptoms: Improved at this time and close to normal.  5.  Prognosis: His disease is incurable but manageable untreatable for the time being.  Aggressive measures are warranted.  6.  Follow-up: We will be in 2 months to follow his progress.  25 minutes was spent with the patient face-to-face today.  More than 50% of time was dedicated to discussing the natural course of his disease, reviewing future treatment options as well as coordinating his plan of  care.     Frank Button, MD 8/30/20192:23 PM

## 2018-07-22 LAB — PROSTATE-SPECIFIC AG, SERUM (LABCORP)

## 2018-07-25 ENCOUNTER — Telehealth: Payer: Self-pay

## 2018-07-25 NOTE — Telephone Encounter (Signed)
Contacted patient and made aware of PSA results. 

## 2018-08-24 ENCOUNTER — Telehealth: Payer: Self-pay | Admitting: Pharmacist

## 2018-08-24 NOTE — Telephone Encounter (Signed)
Oral Oncology Pharmacist Encounter  Received voicemail from patient with question about whether or not it was okay for him to receive a flu shot this year while on treatment with Zytiga and androgen deprivation therapy. Left voicemail for patient that it is a great idea to go on and get a flu shot. Left direct dial to oral oncology clinic if patient should have any additional questions.  Johny Drilling, PharmD, BCPS, BCOP  08/24/2018 9:03 AM Oral Oncology Clinic [phone]

## 2018-09-14 ENCOUNTER — Telehealth: Payer: Self-pay

## 2018-09-14 NOTE — Telephone Encounter (Signed)
Oral Oncology Patient Advocate Encounter  Frank [name] is due to be renewed before 11/21/18. I filled out a new application and placed the doctors forms to sign and give refills if desired in the folder.   I have his 2018 documentation but I will need his signature. I called the patient to make him aware of this and he stated he would sign at his appointment on 09/22/2018  I will continue to update  [place] Patient [place] Phone [phone] Fax [phone]

## 2018-09-22 ENCOUNTER — Inpatient Hospital Stay: Payer: BLUE CROSS/BLUE SHIELD | Attending: Oncology | Admitting: Oncology

## 2018-09-22 ENCOUNTER — Telehealth: Payer: Self-pay | Admitting: Oncology

## 2018-09-22 ENCOUNTER — Inpatient Hospital Stay: Payer: BLUE CROSS/BLUE SHIELD

## 2018-09-22 VITALS — BP 129/87 | HR 93 | Temp 98.0°F | Resp 18 | Ht 69.0 in | Wt 191.0 lb

## 2018-09-22 DIAGNOSIS — C61 Malignant neoplasm of prostate: Secondary | ICD-10-CM | POA: Insufficient documentation

## 2018-09-22 DIAGNOSIS — Z79899 Other long term (current) drug therapy: Secondary | ICD-10-CM

## 2018-09-22 LAB — CBC WITH DIFFERENTIAL (CANCER CENTER ONLY)
ABS IMMATURE GRANULOCYTES: 0.04 10*3/uL (ref 0.00–0.07)
BASOS ABS: 0 10*3/uL (ref 0.0–0.1)
BASOS PCT: 0 %
EOS ABS: 0.1 10*3/uL (ref 0.0–0.5)
Eosinophils Relative: 1 %
HCT: 40.7 % (ref 39.0–52.0)
Hemoglobin: 13.7 g/dL (ref 13.0–17.0)
IMMATURE GRANULOCYTES: 1 %
Lymphocytes Relative: 20 %
Lymphs Abs: 1.4 10*3/uL (ref 0.7–4.0)
MCH: 30.6 pg (ref 26.0–34.0)
MCHC: 33.7 g/dL (ref 30.0–36.0)
MCV: 91.1 fL (ref 80.0–100.0)
MONOS PCT: 5 %
Monocytes Absolute: 0.4 10*3/uL (ref 0.1–1.0)
NEUTROS ABS: 5 10*3/uL (ref 1.7–7.7)
NEUTROS PCT: 73 %
NRBC: 0 % (ref 0.0–0.2)
Platelet Count: 227 10*3/uL (ref 150–400)
RBC: 4.47 MIL/uL (ref 4.22–5.81)
RDW: 12.2 % (ref 11.5–15.5)
WBC: 6.8 10*3/uL (ref 4.0–10.5)

## 2018-09-22 LAB — CMP (CANCER CENTER ONLY)
ALBUMIN: 4.3 g/dL (ref 3.5–5.0)
ALT: 24 U/L (ref 0–44)
AST: 19 U/L (ref 15–41)
Alkaline Phosphatase: 106 U/L (ref 38–126)
Anion gap: 12 (ref 5–15)
BUN: 13 mg/dL (ref 6–20)
CHLORIDE: 109 mmol/L (ref 98–111)
CO2: 26 mmol/L (ref 22–32)
CREATININE: 0.86 mg/dL (ref 0.61–1.24)
Calcium: 9.7 mg/dL (ref 8.9–10.3)
GFR, Est AFR Am: >60 mL/min (ref >60)
Glucose, Bld: 116 mg/dL — ABNORMAL HIGH (ref 70–99)
POTASSIUM: 4 mmol/L (ref 3.5–5.1)
SODIUM: 147 mmol/L — AB (ref 135–145)
Total Bilirubin: 0.5 mg/dL (ref 0.3–1.2)
Total Protein: 7.5 g/dL (ref 6.5–8.1)

## 2018-09-22 NOTE — Telephone Encounter (Signed)
Appts scheduled avs/calendar contrast provided per 11/1 los

## 2018-09-22 NOTE — Progress Notes (Signed)
Hematology and Oncology Follow Up Visit  Frank [name] [id number] [birth year] 47 y.o. 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  He was found to have Gleason score is 5+5 = 10 and PSA of 27.88.   Prior Therapy:  He is status post prostate biopsy March 2019 which confirmed the diagnosis of Gleason score 5+5 = 10 prostate cancer.  Current therapy:  Androgen deprivation currently receiving Lupron under the care of Dr. Jeffie Pollock.  Zytiga 1000 mg prednisone 5 mg daily in April 2019.  Interim History: [name] returns today for a repeat evaluation.  Since the last visit, he reports no major changes in his health.  He continues to tolerate Zytiga with mild fatigue but otherwise no other complaints.  He denies any lower extremity edema, nausea or weight loss.  He remains active and attends to activities of daily living.  He does report some lower back discomfort at times that has not been constant.  Is not associate with any neurological deficits and does not affect his mobility.  He denies any changes in his bowels or urinary function.  His quality of life and performance status remains excellent.  He does not report any headaches, blurry vision, syncope or seizures.  He denies any lethargy or alteration of mental status.  Does not report any fevers, chills or sweats.   Does not report any cough, wheezing or hemoptysis.  Does not report any chest pain, palpitation, orthopnea or leg edema.  Does not report any nausea, vomiting or abdominal pain.  He denies any constipation or diarrhea. Does not report any hematochezia or melena. Does not report any arthralgias or myalgias.   Does not report frequency, urgency or hematuria.  Does not report any skin rashes or lesions.  Does not report any lymphadenopathy or petechiae.  Does not report any changes in mood.  No bleeding or clotting  tendency.  Remaining review of systems is negative.    Medications: I have reviewed the patient's current medications.  Current Outpatient Medications  Medication Sig Dispense Refill  . calcium carbonate (OS-CAL) 600 MG TABS tablet Take 1 tablet (600 mg total) by mouth 2 (two) times daily with a meal. 60 tablet 3  . lansoprazole (PREVACID) 15 MG capsule Take 15 mg by mouth daily at 12 noon.    . predniSONE (DELTASONE) 5 MG tablet Take 1 tablet (5 mg total) by mouth daily with breakfast. (Patient not taking: Reported on 03/14/2018) 30 tablet 90  . tadalafil (CIALIS) 10 MG tablet Take 10 mg by mouth daily.  99  . valACYclovir (VALTREX) 500 MG tablet Take 500 mg by mouth 2 (two) times daily.    Marland [name] ZYTIGA 250 MG tablet TAKE 4 TABLETS BY MOUTH ONCE DAILY AS DIRECTED.  TAKE 1 HOUR BEFORE OR 2 HOURS AFTER A MEAL 120 tablet 11   No current facility-administered medications for this visit.      Allergies: No Known Allergies  Past Medical History, Surgical history, Social history, and Family History were reviewed and updated.    Physical Exam:  Blood pressure 129/87, pulse 93, temperature 98 F (36.7 C), temperature source Oral, resp. rate 18, height 5\' 9"  (1.753 m), weight 191 lb (86.6 kg), SpO2 98 %.   ECOG: 0   General appearance: Alert, awake without any distress. Head: Atraumatic without abnormalities Oropharynx: Without any thrush or ulcers. Eyes: No scleral icterus. Lymph nodes: No lymphadenopathy noted in  the cervical, supraclavicular, or axillary nodes Heart:regular rate and rhythm, without any murmurs or gallops.   Lung: Clear to auscultation without any rhonchi, wheezes or dullness to percussion. Abdomin: Soft, nontender without any shifting dullness or ascites. Musculoskeletal: No clubbing or cyanosis. Neurological: No motor or sensory deficits.  Ambulating without any difficulties.  Intact deep tendon reflexes. Skin: No rashes or lesions. Psychiatric: Mood and affect  appeared normal.     Results for RAY, GERVASI (MRN [id number]) as of 09/22/2018 14:42  Ref. Range 05/30/2018 14:45 07/21/2018 14:07  Prostate Specific Ag, Serum Latest Ref Range: 0.0 - 4.0 ng/mL <0.1 <0.1     Impression and Plan:   47 year old man with the:  1.    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  He presented with pelvic and abdominal adenopathy.  He remains on Zytiga with excellent response to therapy and PSA that is currently undetectable.  Risks and benefits of continuing this medication was reviewed today he is agreeable to continue.  The plan is to repeat staging work-up including CT scan and bone scan.  Given his recent complaints of back pain it is important to document positive response to therapy without any new bone metastasis.  2.  Androgen depravation: I recommended continuing androgen deprivation therapy indefinitely.  He is currently receiving it every 6 months under the care of Dr. Jeffie Pollock.  3. Bone directed therapy: Delton See currently is under consideration but delayed till his dental procedures completed.  I have recommended calcium and vitamin D.  4.  Lower urinary tract symptoms: Remains stable at this time without any progression.  5.  Prognosis: Treatment remains palliative at this time but his performance that is excellent and aggressive therapy is warranted.  6.  Back pain: Appears to be mechanical in nature although cannot rule out malignancy.  Will obtain imaging studies for the next visit to confirm.  7.  Follow-up: We will be in 3 months to follow his progress.  15 minutes was spent with the patient face-to-face today.  More than 50% of time was dedicated to discussing his disease status, treatment options and coordinating plan of care.     [name], MD 11/1/20192:41 PM

## 2018-09-23 LAB — PROSTATE-SPECIFIC AG, SERUM (LABCORP)

## 2018-09-25 ENCOUNTER — Telehealth: Payer: Self-pay

## 2018-09-25 NOTE — Telephone Encounter (Signed)
-----   Message from Wyatt Portela, MD sent at 09/25/2018  8:31 AM EST ----- Please let him know his PSA is low

## 2018-09-25 NOTE — Telephone Encounter (Signed)
Contacted patient and made aware of results. 

## 2018-09-26 ENCOUNTER — Other Ambulatory Visit: Payer: BLUE CROSS/BLUE SHIELD

## 2018-09-26 ENCOUNTER — Ambulatory Visit: Payer: BLUE CROSS/BLUE SHIELD | Admitting: Oncology

## 2018-10-09 DIAGNOSIS — Z23 Encounter for immunization: Secondary | ICD-10-CM | POA: Diagnosis not present

## 2018-10-09 DIAGNOSIS — Z0000 Encounter for general adult medical examination without abnormal findings: Secondary | ICD-10-CM | POA: Diagnosis not present

## 2018-10-11 NOTE — Telephone Encounter (Signed)
Oral Oncology Patient Advocate Encounter  Wynetta Emery and Arts development officer for Fabio Asa was faxed 10/11/18.  This encounter will updated until final determination  [place] Patient [place] Phone [phone] Fax [phone]

## 2018-10-12 ENCOUNTER — Encounter: Payer: Self-pay | Admitting: Medical Oncology

## 2018-10-31 DIAGNOSIS — C778 Secondary and unspecified malignant neoplasm of lymph nodes of multiple regions: Secondary | ICD-10-CM | POA: Diagnosis not present

## 2018-10-31 DIAGNOSIS — C61 Malignant neoplasm of prostate: Secondary | ICD-10-CM | POA: Diagnosis not present

## 2018-10-31 DIAGNOSIS — C7951 Secondary malignant neoplasm of bone: Secondary | ICD-10-CM | POA: Diagnosis not present

## 2018-11-17 NOTE — Telephone Encounter (Signed)
Oral Oncology Patient Advocate Encounter  I called Frank [name] and Frank [name] to follow up on the renewal application and it has been approved 11/22/18-11/22/19.   I called the patient and left a detailed voicemail.   [place] Patient [place] Phone [phone] Fax [phone]

## 2018-12-06 ENCOUNTER — Encounter (HOSPITAL_COMMUNITY)
Admission: RE | Admit: 2018-12-06 | Discharge: 2018-12-06 | Disposition: A | Discharge status: Home / Self Care | Payer: BLUE CROSS/BLUE SHIELD | Source: Ambulatory Visit | Attending: Oncology | Admitting: Oncology

## 2018-12-06 DIAGNOSIS — C61 Malignant neoplasm of prostate: Secondary | ICD-10-CM | POA: Insufficient documentation

## 2018-12-06 MED ORDER — TECHNETIUM TC 99M MEDRONATE IV KIT
20.2000 | PACK | Freq: Once | INTRAVENOUS | Status: AC | PRN
  Administered 2018-12-06: 20.2 via INTRAVENOUS

## 2018-12-12 ENCOUNTER — Ambulatory Visit (HOSPITAL_COMMUNITY)
Admission: RE | Admit: 2018-12-12 | Discharge: 2018-12-12 | Disposition: A | Discharge status: Home / Self Care | Payer: BLUE CROSS/BLUE SHIELD | Source: Ambulatory Visit | Attending: Oncology | Admitting: Oncology

## 2018-12-12 ENCOUNTER — Encounter (HOSPITAL_COMMUNITY): Payer: Self-pay

## 2018-12-12 ENCOUNTER — Inpatient Hospital Stay: Payer: BLUE CROSS/BLUE SHIELD | Attending: Oncology

## 2018-12-12 DIAGNOSIS — Z79899 Other long term (current) drug therapy: Secondary | ICD-10-CM | POA: Insufficient documentation

## 2018-12-12 DIAGNOSIS — C61 Malignant neoplasm of prostate: Secondary | ICD-10-CM | POA: Diagnosis not present

## 2018-12-12 LAB — CMP (CANCER CENTER ONLY)
ALK PHOS: 129 U/L — AB (ref 38–126)
ALT: 31 U/L (ref 0–44)
AST: 18 U/L (ref 15–41)
Albumin: 4.5 g/dL (ref 3.5–5.0)
Anion gap: 13 (ref 5–15)
BILIRUBIN TOTAL: 0.3 mg/dL (ref 0.3–1.2)
BUN: 11 mg/dL (ref 6–20)
CALCIUM: 10 mg/dL (ref 8.9–10.3)
CO2: 23 mmol/L (ref 22–32)
CREATININE: 0.79 mg/dL (ref 0.61–1.24)
Chloride: 108 mmol/L (ref 98–111)
GFR, Estimated: >60 mL/min (ref >60)
Glucose, Bld: 98 mg/dL (ref 70–99)
Potassium: 3.8 mmol/L (ref 3.5–5.1)
Sodium: 144 mmol/L (ref 135–145)
TOTAL PROTEIN: 7.6 g/dL (ref 6.5–8.1)

## 2018-12-12 LAB — CBC WITH DIFFERENTIAL (CANCER CENTER ONLY)
ABS IMMATURE GRANULOCYTES: 0.07 10*3/uL (ref 0.00–0.07)
Basophils Absolute: 0.1 10*3/uL (ref 0.0–0.1)
Basophils Relative: 1 %
EOS PCT: 7 %
Eosinophils Absolute: 0.4 10*3/uL (ref 0.0–0.5)
HEMATOCRIT: 43.8 % (ref 39.0–52.0)
Hemoglobin: 14.8 g/dL (ref 13.0–17.0)
Immature Granulocytes: 1 %
LYMPHS PCT: 28 %
Lymphs Abs: 1.8 10*3/uL (ref 0.7–4.0)
MCH: 30.5 pg (ref 26.0–34.0)
MCHC: 33.8 g/dL (ref 30.0–36.0)
MCV: 90.1 fL (ref 80.0–100.0)
MONO ABS: 0.4 10*3/uL (ref 0.1–1.0)
MONOS PCT: 7 %
NEUTROS ABS: 3.6 10*3/uL (ref 1.7–7.7)
Neutrophils Relative %: 56 %
Platelet Count: 216 10*3/uL (ref 150–400)
RBC: 4.86 MIL/uL (ref 4.22–5.81)
RDW: 11.9 % (ref 11.5–15.5)
WBC Count: 6.3 10*3/uL (ref 4.0–10.5)
nRBC: 0 % (ref 0.0–0.2)

## 2018-12-12 MED ORDER — SODIUM CHLORIDE (PF) 0.9 % IJ SOLN
INTRAMUSCULAR | Status: AC
  Filled 2018-12-12: qty 50

## 2018-12-12 MED ORDER — IOHEXOL 300 MG/ML  SOLN
100.0000 mL | Freq: Once | INTRAMUSCULAR | Status: AC | PRN
  Administered 2018-12-12: 100 mL via INTRAVENOUS

## 2018-12-13 LAB — PROSTATE-SPECIFIC AG, SERUM (LABCORP): Prostate Specific Ag, Serum: <0.1 ng/mL (ref 0.0–4.0)

## 2018-12-14 ENCOUNTER — Encounter: Payer: Self-pay | Admitting: Medical Oncology

## 2018-12-14 ENCOUNTER — Telehealth: Payer: Self-pay | Admitting: Oncology

## 2018-12-14 ENCOUNTER — Inpatient Hospital Stay (HOSPITAL_BASED_OUTPATIENT_CLINIC_OR_DEPARTMENT_OTHER): Payer: BLUE CROSS/BLUE SHIELD | Admitting: Oncology

## 2018-12-14 VITALS — BP 130/85 | HR 94 | Temp 97.6°F | Resp 18 | Ht 69.0 in | Wt 195.3 lb

## 2018-12-14 DIAGNOSIS — C61 Malignant neoplasm of prostate: Secondary | ICD-10-CM | POA: Diagnosis not present

## 2018-12-14 DIAGNOSIS — Z79899 Other long term (current) drug therapy: Secondary | ICD-10-CM

## 2018-12-14 NOTE — Progress Notes (Signed)
Frank [name] here today as follow up and get results of his scans. He states his anxiety has been elevated waiting on scan results. He was happy to share, he got great results. He has done well with Zytiga and Lupron but is experiencing fatigue, low libido and weight gain. I encouraged him to do some type of exercise which will burn calories and help him mentally. I have encouraged him and his wife to attend the prostate support group where he will meet other men that are on the same journey. He is planning to attend in February. I asked him to call me if I can help him in any way to make this journey easier. He voiced understanding.

## 2018-12-14 NOTE — Telephone Encounter (Signed)
Scheduled appt per 01/23 los. ° °Printed avs. °

## 2018-12-14 NOTE — Progress Notes (Signed)
Hematology and Oncology Follow Up Visit  Frank [name] [id number] 03/17/1971 48 y.o. 12/14/2018 3:36 PM [name], MDMorrow, Marjory Lies, MD   Principle Diagnosis: 48 year old man with advanced prostate cancer presented with Gleason score 4+5 = 10 and a PSA of 27.8 in March 2019.  He has castration-sensitive disease.  Prior Therapy:  He is status post prostate biopsy March 2019 which confirmed the diagnosis of Gleason score 5+5 = 10 prostate cancer.  Current therapy:  Androgen deprivation currently receiving Lupron under the care of Dr. Jeffie Pollock.  Zytiga 1000 mg prednisone 5 mg daily in April 2019.  Interim History: [name] returns today for repeat evaluation.  Since last visit, he continues to take Zytiga without any major complications.  He is experiencing some mild fatigue and tiredness associated with therapy.  He also has reported some weight gain and hot flashes associated with Lupron.  He also noticed decrease in his libido.  He denies any bone pain or fractures.  His appetite and performance status is unchanged.  He did develop a mild dermatological reaction after CT scan.  He continues to work full-time without any decline ability to do so.  He does not report any headaches, blurry vision, syncope or seizures.  He denies any alteration mental status or dizziness.  Does not report any fevers, chills or sweats.   Does not report any cough, wheezing or hemoptysis.  Does not report any chest pain, palpitation, orthopnea or leg edema.  Does not report any nausea, vomiting or early satiety.  He denies any changes in bowel habits.   Does not report any bone pain or pathological fractures.  Does not report frequency, urgency or hematuria.  Does not report any skin rashes or lesions.  Does not report any ecchymosis petechiae.  Does not report any major changes in his mood.   Remaining review of systems is negative.    Medications: I have reviewed the patient's current medications.   Current Outpatient Medications  Medication Sig Dispense Refill  . calcium carbonate (OS-CAL) 600 MG TABS tablet Take 1 tablet (600 mg total) by mouth 2 (two) times daily with a meal. 60 tablet 3  . lansoprazole (PREVACID) 15 MG capsule Take 15 mg by mouth daily at 12 noon.    . predniSONE (DELTASONE) 5 MG tablet Take 1 tablet (5 mg total) by mouth daily with breakfast. (Patient not taking: Reported on 03/14/2018) 30 tablet 90  . tadalafil (CIALIS) 10 MG tablet Take 10 mg by mouth daily.  99  . valACYclovir (VALTREX) 500 MG tablet Take 500 mg by mouth 2 (two) times daily.    Marland Kitchen ZYTIGA 250 MG tablet TAKE 4 TABLETS BY MOUTH ONCE DAILY AS DIRECTED.  TAKE 1 HOUR BEFORE OR 2 HOURS AFTER A MEAL 120 tablet 11   No current facility-administered medications for this visit.      Allergies: No Known Allergies  Past Medical History, Surgical history, Social history, and Family History were reviewed and updated.    Physical Exam:  Blood pressure 130/85, pulse 94, temperature 97.6 F (36.4 C), temperature source Oral, resp. rate 18, height 5\' 9"  (1.753 m), weight 195 lb 4.8 oz (88.6 kg), SpO2 98 %.   ECOG: 0   General appearance: Alert, awake without any distress. Head: Atraumatic without abnormalities Oropharynx: Without any thrush or ulcers. Eyes: No scleral icterus. Lymph nodes: No lymphadenopathy noted in the cervical, supraclavicular, or axillary nodes Heart:regular rate and rhythm, without any murmurs or gallops.   Lung: Clear to  auscultation without any rhonchi, wheezes or dullness to percussion. Abdomin: Soft, nontender without any shifting dullness or ascites. Musculoskeletal: No clubbing or cyanosis. Neurological: No motor or sensory deficits. Skin: Very faint rash noted on his wrists and feet bilaterally. Psychiatric: Mood and affect appeared normal.   Results for Frank, [name] (MRN [id number]) as of 12/14/2018 14:32  Ref. Range 09/22/2018 14:30 12/12/2018 07:54  Prostate  Specific Ag, Serum Latest Ref Range: 0.0 - 4.0 ng/mL <0.1 <0.1   EXAM: CT ABDOMEN AND PELVIS WITH CONTRAST  TECHNIQUE: Multidetector CT imaging of the abdomen and pelvis was performed using the standard protocol following bolus administration of intravenous contrast.  CONTRAST:  170mL OMNIPAQUE IOHEXOL 300 MG/ML  SOLN  COMPARISON:  02/23/2018  FINDINGS: Lower chest: Unremarkable.  Hepatobiliary: No focal abnormality within the liver parenchyma. There is no evidence for gallstones, gallbladder wall thickening, or pericholecystic fluid. No intrahepatic or extrahepatic biliary dilation.  Pancreas: No focal mass lesion. No dilatation of the main duct. No intraparenchymal cyst. No peripancreatic edema.  Spleen: No splenomegaly. No focal mass lesion.  Adrenals/Urinary Tract: No adrenal nodule or mass. 2 mm nonobstructing stone identified upper pole left kidney. 1-2 mm nonobstructing stone is identified in the lower pole the right kidney. No evidence for hydroureter. Bladder is nondistended. Several polypoid foci of increased attenuation are seen along the posterior bladder wall (well demonstrated coronal image 64/series 6 and axial image 77 of series 2).  Stomach/Bowel: Stomach is nondistended. No gastric wall thickening. No evidence of outlet obstruction. Duodenum is normally positioned as is the ligament of Treitz. No small bowel wall thickening. No small bowel dilatation. The terminal ileum is normal. The appendix is not visualized, but there is no edema or inflammation in the region of the cecum. No gross colonic mass. No colonic wall thickening.  Vascular/Lymphatic: No abdominal aortic aneurysm. No abdominal aortic atherosclerotic calcification. There is no gastrohepatic or hepatoduodenal ligament lymphadenopathy. Upper abdominal and retroperitoneal lymphadenopathy seen previously has decreased. 18 mm short axis hepato duodenal ligament lymph nodes seen  previously has decreased to 9 mm short axis in the interval (24/2). Index preaortic node measured previously at 11 mm is no longer measurable. 11 mm short axis aortocaval lymph node measured on the prior study is 6 mm short axis today (46/2). Pelvic sidewall lymphadenopathy is decreased in the interval with 19 mm short axis left pelvic sidewall lymph node measured previously decreased in the interval to 7 mm today (68/2). 17 mm short axis left common femoral node measured previously is now 7 mm (71/2). No new or progressive lymphadenopathy in the abdomen/pelvis.  Reproductive: Prostate gland has decreased in size in the interval.  Other: No intraperitoneal free fluid.  Musculoskeletal: New sclerotic bone lesions are identified, including 9 mm sacral lesion (57/2), 8 mm posterior left iliac lesion (55/2). And 11 mm right femoral head lesion (78/2). Recent bone scan demonstrated activity in the T10 vertebral body without discrete T10 lesion evident by CT today. Ribs corresponding to the left rib activity seen on previous bone scan not included on today's CT.  IMPRESSION: 1. Marked interval decrease in abdominopelvic lymphadenopathy. 2. New small sclerotic bone lesions in the pelvis. Given the response of lymphadenopathy, this potentially could represent sclerosis of pre-existing lesions related to healing, but new bony metastatic disease is not excluded. Close attention on follow-up will be required. See above report for discussion relating to recent bone scan. 3. Tiny bilateral nonobstructing renal stones IMPRESSION: 1. New lesion in the anterior aspect of  the left sixth rib. 2. Stable lesion in the posterior aspect of the left eighth rib. 3. Diminished activity in the lesion of the T10 vertebral body Impression and Plan:   48 year old man with the:  1.    Advanced prostate cancer diagnosed in March 2019.  He he has castration-sensitive disease.Marland Kitchen  He is currently on  Zytiga with prednisone with excellent response to therapy.  His PSA remains undetectable.  CT scan and bone scan from December 06, 2018 were personally reviewed and discussed with the patient and his wife.  These imaging studies indicate overall complete response to therapy.  Risks and benefits of continuing this therapy long-term as well as future implication of this therapy as well as additional salvage options.  At this time he is agreeable to continue I will continue to monitor his PSA periodically.  2.  Androgen depravation: Continues to receive Lupron under the care of Dr. Jeffie Pollock which I recommended continuing.  3. Bone directed therapy: I recommended continuing calcium and vitamin D supplements.  Delton See will be considered once dental issues resolved  4.  Prognosis: Therapy is no active at this time although his performance status is excellent and aggressive therapy is warranted.  5.  Back pain: Resolved at this time.  6.  Follow-up: We will be in April 2020 to follow his progress.  25 minutes was spent with the patient face-to-face today.  More than 50% of time was dedicated to reviewing his disease status, imaging studies and answering questions regarding future plan of care.     [name], MD 1/23/20203:36 PM

## 2018-12-14 NOTE — Progress Notes (Signed)
Received a call from [place] with concerns about Frank [name]. She states his mood is down, he is fatigued and is worried about back pain. We discussed the side effects of androgen deprivation and discussed his back pain. I spoke with him and we discussed the side effects and his back pain. He informed me Dr. Alen Blew has ordered follow scans before the next visit to restage. He is anxious and just cannot believe he has prostate cancer at 66.  I encouraged him and his wife to attend our prostate support group here at [facility] which has men of all ages that have or are continuing to be treated. I also offered to refer him to our support services or I can ask Dr. Alen Blew to refer him outside of the cancer center. He voiced understanding and will call me. I encouraged Frank [name] to call if she feels she needs to talk as well.

## 2018-12-27 DIAGNOSIS — R05 Cough: Secondary | ICD-10-CM | POA: Diagnosis not present

## 2018-12-27 DIAGNOSIS — J988 Other specified respiratory disorders: Secondary | ICD-10-CM | POA: Diagnosis not present

## 2019-02-01 ENCOUNTER — Telehealth: Payer: Self-pay | Admitting: *Deleted

## 2019-02-01 NOTE — Telephone Encounter (Signed)
No. But he still has to bee very careful around people and avoid sick contacts.

## 2019-02-01 NOTE — Telephone Encounter (Signed)
Patient worried about coronavirus d/t taking zytiga and lupron. Per dr Alen Blew. Just avoid sick people and use good handwashing. Patient verbalized understanding.

## 2019-02-01 NOTE — Telephone Encounter (Signed)
Patient calling. States he takes zytiga,prednisone and lupron. Is he immuno compromised concerning the coronavirus?

## 2019-02-21 ENCOUNTER — Inpatient Hospital Stay: Payer: BLUE CROSS/BLUE SHIELD | Admitting: Oncology

## 2019-02-21 ENCOUNTER — Inpatient Hospital Stay: Payer: BLUE CROSS/BLUE SHIELD | Attending: Oncology

## 2019-02-22 ENCOUNTER — Telehealth: Payer: Self-pay | Admitting: Oncology

## 2019-02-22 NOTE — Telephone Encounter (Signed)
Called patient per 4/1 sch message to reschedule appt - unable to reach patient. Left message with call back number for reschedule.

## 2019-04-10 ENCOUNTER — Other Ambulatory Visit: Payer: Self-pay | Admitting: Oncology

## 2019-04-12 ENCOUNTER — Other Ambulatory Visit: Payer: Self-pay | Admitting: Oncology

## 2019-04-12 DIAGNOSIS — C61 Malignant neoplasm of prostate: Secondary | ICD-10-CM

## 2019-04-23 ENCOUNTER — Telehealth: Payer: Self-pay | Admitting: Oncology

## 2019-04-23 NOTE — Telephone Encounter (Signed)
Returned patient's phone call regarding rescheduling an appointment, lab and follow up appointment has been rescheduled from 04/01 to 06/05.   Message to provider.

## 2019-04-24 DIAGNOSIS — Z20828 Contact with and (suspected) exposure to other viral communicable diseases: Secondary | ICD-10-CM | POA: Diagnosis not present

## 2019-04-27 ENCOUNTER — Inpatient Hospital Stay: Payer: BC Managed Care – PPO

## 2019-04-27 ENCOUNTER — Other Ambulatory Visit: Payer: Self-pay

## 2019-04-27 ENCOUNTER — Inpatient Hospital Stay: Payer: BC Managed Care – PPO | Attending: Oncology | Admitting: Oncology

## 2019-04-27 VITALS — BP 134/84 | HR 94 | Temp 99.1°F | Resp 18 | Ht 69.0 in | Wt 194.8 lb

## 2019-04-27 DIAGNOSIS — C7951 Secondary malignant neoplasm of bone: Secondary | ICD-10-CM | POA: Diagnosis not present

## 2019-04-27 DIAGNOSIS — C61 Malignant neoplasm of prostate: Secondary | ICD-10-CM

## 2019-04-27 LAB — CBC WITH DIFFERENTIAL (CANCER CENTER ONLY)
Abs Immature Granulocytes: 0.05 10*3/uL (ref 0.00–0.07)
Basophils Absolute: 0 10*3/uL (ref 0.0–0.1)
Basophils Relative: 0 %
Eosinophils Absolute: 0.1 10*3/uL (ref 0.0–0.5)
Eosinophils Relative: 1 %
HCT: 41.9 % (ref 39.0–52.0)
Hemoglobin: 14 g/dL (ref 13.0–17.0)
Immature Granulocytes: 1 %
Lymphocytes Relative: 14 %
Lymphs Abs: 1.2 10*3/uL (ref 0.7–4.0)
MCH: 30.8 pg (ref 26.0–34.0)
MCHC: 33.4 g/dL (ref 30.0–36.0)
MCV: 92.3 fL (ref 80.0–100.0)
Monocytes Absolute: 0.4 10*3/uL (ref 0.1–1.0)
Monocytes Relative: 5 %
Neutro Abs: 6.8 10*3/uL (ref 1.7–7.7)
Neutrophils Relative %: 79 %
Platelet Count: 206 10*3/uL (ref 150–400)
RBC: 4.54 MIL/uL (ref 4.22–5.81)
RDW: 11.9 % (ref 11.5–15.5)
WBC Count: 8.6 10*3/uL (ref 4.0–10.5)
nRBC: 0 % (ref 0.0–0.2)

## 2019-04-27 LAB — CMP (CANCER CENTER ONLY)
ALT: 21 U/L (ref 0–44)
AST: 18 U/L (ref 15–41)
Albumin: 4.5 g/dL (ref 3.5–5.0)
Alkaline Phosphatase: 147 U/L — ABNORMAL HIGH (ref 38–126)
Anion gap: 13 (ref 5–15)
BUN: 10 mg/dL (ref 6–20)
CO2: 20 mmol/L — ABNORMAL LOW (ref 22–32)
Calcium: 9.3 mg/dL (ref 8.9–10.3)
Chloride: 110 mmol/L (ref 98–111)
Creatinine: 0.8 mg/dL (ref 0.61–1.24)
GFR, Est AFR Am: >60 mL/min (ref >60)
GFR, Estimated: >60 mL/min (ref >60)
Glucose, Bld: 111 mg/dL — ABNORMAL HIGH (ref 70–99)
Potassium: 4 mmol/L (ref 3.5–5.1)
Sodium: 143 mmol/L (ref 135–145)
Total Bilirubin: 0.4 mg/dL (ref 0.3–1.2)
Total Protein: 7.7 g/dL (ref 6.5–8.1)

## 2019-04-27 NOTE — Progress Notes (Signed)
Hematology and Oncology Follow Up Visit  Frank [name] [id number] Aug 21, 1971 48 y.o. 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  He was found to have Gleason score 4+5 = 10 and a PSA of 27.8 in March 2019.    Prior Therapy:  He is status post prostate biopsy March 2019 which confirmed the diagnosis of Gleason score 5+5 = 10 prostate cancer.  Current therapy:  Androgen deprivation currently receiving Lupron under the care of Dr. Jeffie [name].  Zytiga 1000 mg prednisone 5 mg daily in April 2019.  Interim History: [name] is here for a repeat evaluation.  Since the last visit, he reports no major changes in his health.  He continues to tolerate Zytiga without any recent complaints.  He remains active and continues to attend activities of daily living.  He denies any lower extremity edema, weakness or recurrent infections.  Quality of life remained maintained.  He denied any alteration mental status, neuropathy, confusion or dizziness.  Denies any headaches or lethargy.  Denies any night sweats, weight loss or changes in appetite.  Denied orthopnea, dyspnea on exertion or chest discomfort.  Denies shortness of breath, difficulty breathing hemoptysis or cough.  Denies any abdominal distention, nausea, early satiety or dyspepsia.  Denies any hematuria, frequency, dysuria or nocturia.  Denies any skin irritation, dryness or rash.  Denies any ecchymosis or petechiae.  Denies any lymphadenopathy or clotting.  Denies any heat or cold intolerance.  Denies any anxiety or depression.  Remaining review of system is negative.     Medications: I have reviewed the patient's current medications.  Current Outpatient Medications  Medication Sig Dispense Refill  . calcium carbonate (OS-CAL) 600 MG TABS tablet Take 1 tablet (600 mg total) by mouth 2 (two) times daily with a meal. 60 tablet 3   . lansoprazole (PREVACID) 15 MG capsule Take 15 mg by mouth daily at 12 noon.    . predniSONE (DELTASONE) 5 MG tablet TAKE 1 TABLET BY MOUTH EVERY DAY WITH BREAKFAST 30 tablet 90  . tadalafil (CIALIS) 10 MG tablet Take 10 mg by mouth daily.  99  . valACYclovir (VALTREX) 500 MG tablet Take 500 mg by mouth 2 (two) times daily.    Marland Kitchen ZYTIGA 250 MG tablet TAKE 4 TABLETS BY MOUTH ONCE DAILY AS DIRECTED.  TAKE 1 HOUR BEFORE OR 2 HOURS AFTER A MEAL 120 tablet 0   No current facility-administered medications for this visit.      Allergies: No Known Allergies  Past Medical History, Surgical history, Social history, and Family History were reviewed and updated.    Physical Exam:  Blood pressure 134/84, pulse 94, temperature 99.1 F (37.3 C), temperature source Oral, resp. rate 18, height 5\' 9"  (1.753 m), weight 194 lb 12.8 oz (88.4 kg), SpO2 99 %.    ECOG: 0   General appearance: Comfortable appearing without any discomfort Head: Normocephalic without any trauma Oropharynx: Mucous membranes are moist and pink without any thrush or ulcers. Eyes: Pupils are equal and round reactive to light. Lymph nodes: No cervical, supraclavicular, inguinal or axillary lymphadenopathy.   Heart:regular rate and rhythm.  S1 and S2 without leg edema. Lung: Clear without any rhonchi or wheezes.  No dullness to percussion. Abdomin: Soft, nontender, nondistended with good bowel sounds.  No hepatosplenomegaly. Musculoskeletal: No joint deformity or effusion.  Full range of motion noted. Neurological: No deficits noted on motor, sensory and deep  tendon reflex exam. Skin: No petechial rash or dryness.  Appeared moist.  Psychiatric: Mood and affect appeared appropriate.  CBC    Component Value Date/Time   WBC 8.6 04/27/2019 1304   RBC 4.54 04/27/2019 1304   HGB 14.0 04/27/2019 1304   HCT 41.9 04/27/2019 1304   PLT 206 04/27/2019 1304   MCV 92.3 04/27/2019 1304   MCH 30.8 04/27/2019 1304   MCHC 33.4  04/27/2019 1304   RDW 11.9 04/27/2019 1304   LYMPHSABS 1.2 04/27/2019 1304   MONOABS 0.4 04/27/2019 1304   EOSABS 0.1 04/27/2019 1304   BASOSABS 0.0 04/27/2019 1304     Chemistry      Component Value Date/Time   NA 144 12/12/2018 0754   K 3.8 12/12/2018 0754   CL 108 12/12/2018 0754   CO2 23 12/12/2018 0754   BUN 11 12/12/2018 0754   CREATININE 0.79 12/12/2018 0754      Component Value Date/Time   CALCIUM 10.0 12/12/2018 0754   ALKPHOS 129 (H) 12/12/2018 0754   AST 18 12/12/2018 0754   ALT 31 12/12/2018 0754   BILITOT 0.3 12/12/2018 0754         49 year old man with the:  1.    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    He remains on Zytiga without any major complications.  His PSA continues to be undetectable with imaging studies in January 2020 did not show any evidence of relapsed disease.  Risks and benefits of continuing this current therapy was discussed today and he is agreeable to continue.  Different salvage therapy may be needed if he developed castration resistant disease.  These options would include systemic chemotherapy among other choices.  2.  Androgen depravation: He is currently receiving Lupron under the care of Dr. Jeffie [name] which I recommended continuing indefinitely.  3. Bone directed therapy: He continues to be on calcium and vitamin D supplements which I recommended the time being.  Delton See is deferred at this time.  4.  Prognosis: His disease is incurable although aggressive measures are warranted given his excellent performance status.  5.  Back pain: Appears to be mechanical in nature and not related to his cancer.  6.  Follow-up: We will be in 3 months for repeat evaluation.  25 minutes was spent with the patient face-to-face today.  More than 50% of time was spent on reviewing his disease status, treatment options, complications like therapy and future plan of care.     Frank Button, MD 6/5/20201:13 PM

## 2019-04-28 LAB — PROSTATE-SPECIFIC AG, SERUM (LABCORP): Prostate Specific Ag, Serum: <0.1 ng/mL (ref 0.0–4.0)

## 2019-04-30 ENCOUNTER — Telehealth: Payer: Self-pay

## 2019-04-30 NOTE — Telephone Encounter (Signed)
Spoke to patient and let him know that per Dr. Alen Blew PSA is still very low at less than 0.1. Patient verbalized understanding.

## 2019-04-30 NOTE — Telephone Encounter (Signed)
-----   Message from Wyatt Portela, MD sent at 04/30/2019  8:32 AM EDT ----- Please let him know his PSA is very low still.

## 2019-05-01 DIAGNOSIS — C61 Malignant neoplasm of prostate: Secondary | ICD-10-CM | POA: Diagnosis not present

## 2019-05-01 DIAGNOSIS — R6882 Decreased libido: Secondary | ICD-10-CM | POA: Diagnosis not present

## 2019-05-01 DIAGNOSIS — C7951 Secondary malignant neoplasm of bone: Secondary | ICD-10-CM | POA: Diagnosis not present

## 2019-05-01 DIAGNOSIS — C778 Secondary and unspecified malignant neoplasm of lymph nodes of multiple regions: Secondary | ICD-10-CM | POA: Diagnosis not present

## 2019-05-02 ENCOUNTER — Other Ambulatory Visit: Payer: Self-pay | Admitting: Oncology

## 2019-05-02 DIAGNOSIS — C61 Malignant neoplasm of prostate: Secondary | ICD-10-CM

## 2019-05-04 ENCOUNTER — Other Ambulatory Visit: Payer: Self-pay

## 2019-05-04 ENCOUNTER — Emergency Department (HOSPITAL_COMMUNITY): Payer: BC Managed Care – PPO

## 2019-05-04 ENCOUNTER — Emergency Department (HOSPITAL_COMMUNITY)
Admission: EM | Admit: 2019-05-04 | Discharge: 2019-05-04 | Disposition: A | Discharge status: Home / Self Care | Payer: BC Managed Care – PPO | Attending: Emergency Medicine | Admitting: Emergency Medicine

## 2019-05-04 ENCOUNTER — Encounter (HOSPITAL_COMMUNITY): Payer: Self-pay | Admitting: Emergency Medicine

## 2019-05-04 DIAGNOSIS — R000 Tachycardia, unspecified: Secondary | ICD-10-CM | POA: Diagnosis not present

## 2019-05-04 DIAGNOSIS — Y999 Unspecified external cause status: Secondary | ICD-10-CM | POA: Diagnosis not present

## 2019-05-04 DIAGNOSIS — S6991XA Unspecified injury of right wrist, hand and finger(s), initial encounter: Secondary | ICD-10-CM | POA: Diagnosis not present

## 2019-05-04 DIAGNOSIS — Z79899 Other long term (current) drug therapy: Secondary | ICD-10-CM | POA: Insufficient documentation

## 2019-05-04 DIAGNOSIS — M79643 Pain in unspecified hand: Secondary | ICD-10-CM | POA: Diagnosis not present

## 2019-05-04 DIAGNOSIS — Z8546 Personal history of malignant neoplasm of prostate: Secondary | ICD-10-CM | POA: Diagnosis not present

## 2019-05-04 DIAGNOSIS — R52 Pain, unspecified: Secondary | ICD-10-CM | POA: Diagnosis not present

## 2019-05-04 DIAGNOSIS — Y9389 Activity, other specified: Secondary | ICD-10-CM | POA: Insufficient documentation

## 2019-05-04 DIAGNOSIS — M25541 Pain in joints of right hand: Secondary | ICD-10-CM | POA: Diagnosis not present

## 2019-05-04 DIAGNOSIS — Y92410 Unspecified street and highway as the place of occurrence of the external cause: Secondary | ICD-10-CM | POA: Diagnosis not present

## 2019-05-04 DIAGNOSIS — Z87891 Personal history of nicotine dependence: Secondary | ICD-10-CM | POA: Insufficient documentation

## 2019-05-04 DIAGNOSIS — M25531 Pain in right wrist: Secondary | ICD-10-CM | POA: Diagnosis not present

## 2019-05-04 DIAGNOSIS — S60221A Contusion of right hand, initial encounter: Secondary | ICD-10-CM

## 2019-05-04 HISTORY — DX: Malignant neoplasm of prostate: C61

## 2019-05-04 MED ORDER — HYDROCODONE-ACETAMINOPHEN 5-325 MG PO TABS: 1.0000 | ORAL_TABLET | ORAL | 0 refills | Status: DC | PRN

## 2019-05-04 NOTE — ED Notes (Signed)
Ice pack applied to extremity.
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
## 2019-05-09 DIAGNOSIS — M79641 Pain in right hand: Secondary | ICD-10-CM | POA: Diagnosis not present

## 2019-05-09 DIAGNOSIS — S62302A Unspecified fracture of third metacarpal bone, right hand, initial encounter for closed fracture: Secondary | ICD-10-CM | POA: Diagnosis not present

## 2019-05-15 ENCOUNTER — Telehealth: Payer: Self-pay | Admitting: Oncology

## 2019-05-15 NOTE — Telephone Encounter (Signed)
FAXED RECORDS TO [facility] [phone]

## 2019-05-16 DIAGNOSIS — M79641 Pain in right hand: Secondary | ICD-10-CM | POA: Diagnosis not present

## 2019-05-23 DIAGNOSIS — S62302A Unspecified fracture of third metacarpal bone, right hand, initial encounter for closed fracture: Secondary | ICD-10-CM | POA: Diagnosis not present

## 2019-05-23 DIAGNOSIS — M79641 Pain in right hand: Secondary | ICD-10-CM | POA: Diagnosis not present

## 2019-05-30 ENCOUNTER — Other Ambulatory Visit: Payer: Self-pay | Admitting: Oncology

## 2019-05-30 DIAGNOSIS — C61 Malignant neoplasm of prostate: Secondary | ICD-10-CM

## 2019-05-31 ENCOUNTER — Telehealth: Payer: Self-pay | Admitting: Oncology

## 2019-05-31 NOTE — Telephone Encounter (Signed)
Scheduled appt per 7/08 los - unable to reach pt . Left message and mailed reminder letter in the mail.

## 2019-06-01 DIAGNOSIS — M79641 Pain in right hand: Secondary | ICD-10-CM | POA: Diagnosis not present

## 2019-06-12 DIAGNOSIS — M79641 Pain in right hand: Secondary | ICD-10-CM | POA: Diagnosis not present

## 2019-06-12 DIAGNOSIS — S62302A Unspecified fracture of third metacarpal bone, right hand, initial encounter for closed fracture: Secondary | ICD-10-CM | POA: Diagnosis not present

## 2019-06-20 DIAGNOSIS — M79641 Pain in right hand: Secondary | ICD-10-CM | POA: Diagnosis not present

## 2019-06-22 DIAGNOSIS — M79641 Pain in right hand: Secondary | ICD-10-CM | POA: Diagnosis not present

## 2019-06-25 DIAGNOSIS — M79641 Pain in right hand: Secondary | ICD-10-CM | POA: Diagnosis not present

## 2019-06-28 DIAGNOSIS — M79641 Pain in right hand: Secondary | ICD-10-CM | POA: Diagnosis not present

## 2019-07-02 ENCOUNTER — Other Ambulatory Visit: Payer: Self-pay | Admitting: Oncology

## 2019-07-02 DIAGNOSIS — M79641 Pain in right hand: Secondary | ICD-10-CM | POA: Diagnosis not present

## 2019-07-02 DIAGNOSIS — C61 Malignant neoplasm of prostate: Secondary | ICD-10-CM

## 2019-07-05 DIAGNOSIS — M79641 Pain in right hand: Secondary | ICD-10-CM | POA: Diagnosis not present

## 2019-07-10 DIAGNOSIS — S62302A Unspecified fracture of third metacarpal bone, right hand, initial encounter for closed fracture: Secondary | ICD-10-CM | POA: Diagnosis not present

## 2019-07-10 DIAGNOSIS — M79641 Pain in right hand: Secondary | ICD-10-CM | POA: Diagnosis not present

## 2019-07-16 DIAGNOSIS — M79641 Pain in right hand: Secondary | ICD-10-CM | POA: Diagnosis not present

## 2019-07-20 DIAGNOSIS — M79641 Pain in right hand: Secondary | ICD-10-CM | POA: Diagnosis not present

## 2019-07-25 ENCOUNTER — Other Ambulatory Visit: Payer: Self-pay | Admitting: Oncology

## 2019-07-25 DIAGNOSIS — C61 Malignant neoplasm of prostate: Secondary | ICD-10-CM

## 2019-07-25 DIAGNOSIS — M79641 Pain in right hand: Secondary | ICD-10-CM | POA: Diagnosis not present

## 2019-07-27 DIAGNOSIS — M79641 Pain in right hand: Secondary | ICD-10-CM | POA: Diagnosis not present

## 2019-07-31 DIAGNOSIS — M79641 Pain in right hand: Secondary | ICD-10-CM | POA: Diagnosis not present

## 2019-08-02 DIAGNOSIS — M79641 Pain in right hand: Secondary | ICD-10-CM | POA: Diagnosis not present

## 2019-08-03 ENCOUNTER — Inpatient Hospital Stay: Payer: BC Managed Care – PPO

## 2019-08-03 ENCOUNTER — Other Ambulatory Visit: Payer: Self-pay

## 2019-08-03 ENCOUNTER — Inpatient Hospital Stay: Payer: BC Managed Care – PPO | Attending: Oncology | Admitting: Oncology

## 2019-08-03 VITALS — BP 130/86 | HR 100 | Temp 98.5°F | Resp 18 | Ht 69.0 in | Wt 194.6 lb

## 2019-08-03 DIAGNOSIS — C7951 Secondary malignant neoplasm of bone: Secondary | ICD-10-CM | POA: Insufficient documentation

## 2019-08-03 DIAGNOSIS — C61 Malignant neoplasm of prostate: Secondary | ICD-10-CM | POA: Insufficient documentation

## 2019-08-03 LAB — CMP (CANCER CENTER ONLY)
ALT: 19 U/L (ref 0–44)
AST: 17 U/L (ref 15–41)
Albumin: 4.5 g/dL (ref 3.5–5.0)
Alkaline Phosphatase: 120 U/L (ref 38–126)
Anion gap: 7 (ref 5–15)
BUN: 10 mg/dL (ref 6–20)
CO2: 26 mmol/L (ref 22–32)
Calcium: 9.4 mg/dL (ref 8.9–10.3)
Chloride: 110 mmol/L (ref 98–111)
Creatinine: 0.78 mg/dL (ref 0.61–1.24)
GFR, Est AFR Am: >60 mL/min (ref >60)
GFR, Estimated: >60 mL/min (ref >60)
Glucose, Bld: 115 mg/dL — ABNORMAL HIGH (ref 70–99)
Potassium: 3.8 mmol/L (ref 3.5–5.1)
Sodium: 143 mmol/L (ref 135–145)
Total Bilirubin: 0.4 mg/dL (ref 0.3–1.2)
Total Protein: 7.2 g/dL (ref 6.5–8.1)

## 2019-08-03 LAB — CBC WITH DIFFERENTIAL (CANCER CENTER ONLY)
Abs Immature Granulocytes: 0.03 10*3/uL (ref 0.00–0.07)
Basophils Absolute: 0 10*3/uL (ref 0.0–0.1)
Basophils Relative: 1 %
Eosinophils Absolute: 0.1 10*3/uL (ref 0.0–0.5)
Eosinophils Relative: 2 %
HCT: 40.9 % (ref 39.0–52.0)
Hemoglobin: 13.8 g/dL (ref 13.0–17.0)
Immature Granulocytes: 1 %
Lymphocytes Relative: 22 %
Lymphs Abs: 1.3 10*3/uL (ref 0.7–4.0)
MCH: 30.5 pg (ref 26.0–34.0)
MCHC: 33.7 g/dL (ref 30.0–36.0)
MCV: 90.5 fL (ref 80.0–100.0)
Monocytes Absolute: 0.4 10*3/uL (ref 0.1–1.0)
Monocytes Relative: 7 %
Neutro Abs: 4 10*3/uL (ref 1.7–7.7)
Neutrophils Relative %: 67 %
Platelet Count: 193 10*3/uL (ref 150–400)
RBC: 4.52 MIL/uL (ref 4.22–5.81)
RDW: 11.9 % (ref 11.5–15.5)
WBC Count: 5.9 10*3/uL (ref 4.0–10.5)
nRBC: 0 % (ref 0.0–0.2)

## 2019-08-03 MED ORDER — CALCIUM CARBONATE 600 MG PO TABS: 600.0000 mg | ORAL_TABLET | Freq: Two times a day (BID) | ORAL | 3 refills | Status: DC
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
## 2019-08-03 NOTE — Addendum Note (Signed)
Addended by: Wyatt Portela on: 08/03/2019 12:25 PM   Modules accepted: Orders

## 2019-08-04 LAB — PROSTATE-SPECIFIC AG, SERUM (LABCORP): Prostate Specific Ag, Serum: <0.1 ng/mL (ref 0.0–4.0)

## 2019-08-06 ENCOUNTER — Telehealth: Payer: Self-pay

## 2019-08-06 DIAGNOSIS — M79641 Pain in right hand: Secondary | ICD-10-CM | POA: Diagnosis not present

## 2019-08-06 NOTE — Telephone Encounter (Signed)
Called patient and let him know his PSA is still low. Patient verbalized understanding.  

## 2019-08-06 NOTE — Telephone Encounter (Signed)
-----   Message from Wyatt Portela, MD sent at 08/06/2019  8:25 AM EDT ----- Please let him know his PSA is still low.

## 2019-08-07 DIAGNOSIS — M79641 Pain in right hand: Secondary | ICD-10-CM | POA: Diagnosis not present

## 2019-08-07 DIAGNOSIS — S62302A Unspecified fracture of third metacarpal bone, right hand, initial encounter for closed fracture: Secondary | ICD-10-CM | POA: Diagnosis not present

## 2019-08-16 DIAGNOSIS — M79641 Pain in right hand: Secondary | ICD-10-CM | POA: Diagnosis not present

## 2019-08-23 ENCOUNTER — Other Ambulatory Visit: Payer: Self-pay | Admitting: Oncology

## 2019-08-23 DIAGNOSIS — M79641 Pain in right hand: Secondary | ICD-10-CM | POA: Diagnosis not present

## 2019-08-23 DIAGNOSIS — C61 Malignant neoplasm of prostate: Secondary | ICD-10-CM

## 2019-08-30 DIAGNOSIS — M79641 Pain in right hand: Secondary | ICD-10-CM | POA: Diagnosis not present

## 2019-09-06 DIAGNOSIS — M79641 Pain in right hand: Secondary | ICD-10-CM | POA: Diagnosis not present

## 2019-09-11 ENCOUNTER — Telehealth: Payer: Self-pay

## 2019-09-11 NOTE — Telephone Encounter (Signed)
Oral Oncology Patient Advocate Encounter  Zytiga patient assistance with [employer] and [employer] will expire 11/22/19.  I called the patient to talk about working on getting the renewal application signed and get his income info. The patient stated that he received a letter from [employer] that said his Fabio Asa has been automatically approved to be filled through them until 11/21/20.  We will reapply at the end of 2021 if necessary.  [facility] [place] Phone [phone] Fax [phone] 09/11/2019   3:09 PM

## 2019-09-13 DIAGNOSIS — M79641 Pain in right hand: Secondary | ICD-10-CM | POA: Diagnosis not present

## 2019-09-20 DIAGNOSIS — M79641 Pain in right hand: Secondary | ICD-10-CM | POA: Diagnosis not present

## 2019-09-26 DIAGNOSIS — M79641 Pain in right hand: Secondary | ICD-10-CM | POA: Diagnosis not present

## 2019-10-03 DIAGNOSIS — M79641 Pain in right hand: Secondary | ICD-10-CM | POA: Diagnosis not present

## 2019-10-09 DIAGNOSIS — S62302D Unspecified fracture of third metacarpal bone, right hand, subsequent encounter for fracture with routine healing: Secondary | ICD-10-CM | POA: Diagnosis not present

## 2019-10-09 DIAGNOSIS — M79641 Pain in right hand: Secondary | ICD-10-CM | POA: Diagnosis not present

## 2019-10-17 ENCOUNTER — Other Ambulatory Visit: Payer: Self-pay

## 2019-10-17 MED ORDER — CALCIUM CARBONATE 600 MG PO TABS: 600.0000 mg | ORAL_TABLET | Freq: Two times a day (BID) | ORAL | 3 refills | Status: DC

## 2019-11-01 DIAGNOSIS — R6882 Decreased libido: Secondary | ICD-10-CM | POA: Diagnosis not present

## 2019-11-01 DIAGNOSIS — C778 Secondary and unspecified malignant neoplasm of lymph nodes of multiple regions: Secondary | ICD-10-CM | POA: Diagnosis not present

## 2019-11-01 DIAGNOSIS — C61 Malignant neoplasm of prostate: Secondary | ICD-10-CM | POA: Diagnosis not present

## 2019-11-01 DIAGNOSIS — C7951 Secondary malignant neoplasm of bone: Secondary | ICD-10-CM | POA: Diagnosis not present

## 2019-11-27 ENCOUNTER — Encounter (HOSPITAL_COMMUNITY)
Admission: RE | Admit: 2019-11-27 | Discharge: 2019-11-27 | Disposition: A | Discharge status: Home / Self Care | Payer: BC Managed Care – PPO | Source: Ambulatory Visit | Attending: Oncology | Admitting: Oncology

## 2019-11-27 ENCOUNTER — Other Ambulatory Visit: Payer: Self-pay

## 2019-11-27 ENCOUNTER — Ambulatory Visit (HOSPITAL_COMMUNITY)
Admission: RE | Admit: 2019-11-27 | Discharge: 2019-11-27 | Disposition: A | Discharge status: Home / Self Care | Payer: BC Managed Care – PPO | Source: Ambulatory Visit | Attending: Oncology | Admitting: Oncology

## 2019-11-27 ENCOUNTER — Inpatient Hospital Stay: Payer: BC Managed Care – PPO | Attending: Oncology

## 2019-11-27 DIAGNOSIS — C61 Malignant neoplasm of prostate: Secondary | ICD-10-CM

## 2019-11-27 DIAGNOSIS — N200 Calculus of kidney: Secondary | ICD-10-CM | POA: Diagnosis not present

## 2019-11-27 DIAGNOSIS — Z8546 Personal history of malignant neoplasm of prostate: Secondary | ICD-10-CM | POA: Diagnosis not present

## 2019-11-27 LAB — CMP (CANCER CENTER ONLY)
ALT: 22 U/L (ref 0–44)
AST: 20 U/L (ref 15–41)
Albumin: 4.6 g/dL (ref 3.5–5.0)
Alkaline Phosphatase: 109 U/L (ref 38–126)
Anion gap: 8 (ref 5–15)
BUN: 15 mg/dL (ref 6–20)
CO2: 25 mmol/L (ref 22–32)
Calcium: 9.5 mg/dL (ref 8.9–10.3)
Chloride: 109 mmol/L (ref 98–111)
Creatinine: 0.79 mg/dL (ref 0.61–1.24)
GFR, Est AFR Am: >60 mL/min (ref >60)
GFR, Estimated: >60 mL/min (ref >60)
Glucose, Bld: 139 mg/dL — ABNORMAL HIGH (ref 70–99)
Potassium: 3.3 mmol/L — ABNORMAL LOW (ref 3.5–5.1)
Sodium: 142 mmol/L (ref 135–145)
Total Bilirubin: 0.8 mg/dL (ref 0.3–1.2)
Total Protein: 7.3 g/dL (ref 6.5–8.1)

## 2019-11-27 LAB — CBC WITH DIFFERENTIAL (CANCER CENTER ONLY)
Abs Immature Granulocytes: 0.03 10*3/uL (ref 0.00–0.07)
Basophils Absolute: 0 10*3/uL (ref 0.0–0.1)
Basophils Relative: 1 %
Eosinophils Absolute: 0.1 10*3/uL (ref 0.0–0.5)
Eosinophils Relative: 3 %
HCT: 41.6 % (ref 39.0–52.0)
Hemoglobin: 14.1 g/dL (ref 13.0–17.0)
Immature Granulocytes: 1 %
Lymphocytes Relative: 32 %
Lymphs Abs: 1.5 10*3/uL (ref 0.7–4.0)
MCH: 30.7 pg (ref 26.0–34.0)
MCHC: 33.9 g/dL (ref 30.0–36.0)
MCV: 90.6 fL (ref 80.0–100.0)
Monocytes Absolute: 0.4 10*3/uL (ref 0.1–1.0)
Monocytes Relative: 9 %
Neutro Abs: 2.5 10*3/uL (ref 1.7–7.7)
Neutrophils Relative %: 54 %
Platelet Count: 194 10*3/uL (ref 150–400)
RBC: 4.59 MIL/uL (ref 4.22–5.81)
RDW: 12 % (ref 11.5–15.5)
WBC Count: 4.6 10*3/uL (ref 4.0–10.5)
nRBC: 0 % (ref 0.0–0.2)

## 2019-11-27 MED ORDER — TECHNETIUM TC 99M MEDRONATE IV KIT
21.5000 | PACK | Freq: Once | INTRAVENOUS | Status: AC | PRN
  Administered 2019-11-27: 21.5 via INTRAVENOUS

## 2019-11-28 LAB — PROSTATE-SPECIFIC AG, SERUM (LABCORP): Prostate Specific Ag, Serum: <0.1 ng/mL (ref 0.0–4.0)

## 2019-12-04 ENCOUNTER — Telehealth (HOSPITAL_BASED_OUTPATIENT_CLINIC_OR_DEPARTMENT_OTHER): Payer: BC Managed Care – PPO | Admitting: Oncology

## 2019-12-04 DIAGNOSIS — C61 Malignant neoplasm of prostate: Secondary | ICD-10-CM | POA: Diagnosis not present

## 2019-12-04 NOTE — Progress Notes (Signed)
Hematology and Oncology Follow Up for Telemedicine Visits  Frank [name] [id number] 1970/12/08 49 y.o. 12/04/2019 9:19 AM Frank [name], MDMorrow, Frank Lies, MD   I connected with Frank [name] on 12/04/19 at  9:00 AM EST by video enabled telemedicine visit and verified that I am speaking with the correct person using two identifiers.   I discussed the limitations, risks, security and privacy concerns of performing an evaluation and management service by telemedicine and the availability of in-person appointments. I also discussed with the patient that there may be a patient responsible charge related to this service. The patient expressed understanding and agreed to proceed.  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  He presented with Gleason score 4+5 = 10 and a PSA of 27.8 at that time.  Prior Therapy:  He is status post prostate biopsy March 2019 which confirmed the diagnosis of Gleason score 5+5 = 10 prostate cancer.  Current therapy:  Androgen deprivation currently receiving Lupron under the care of Dr. Jeffie Pollock.  Zytiga 1000 mg prednisone 5 mg daily in April 2019.  Interim History: Frank [name]  reports feeling well at this time without any major complaints.  Continues to tolerate Zytiga with androgen deprivation without any issues.  He he does report some occasional fatigue and tiredness.  He does report some mental lethargy manageable overall.  He eats well continues to exercise much as he can.  Denies any bone pain or pathological fractures.   Medications: I have reviewed the patient's current medications.  Current Outpatient Medications  Medication Sig Dispense Refill  . calcium carbonate (OS-CAL) 600 MG TABS tablet Take 1 tablet (600 mg  total) by mouth 2 (two) times daily with a meal. 60 tablet 3  . HYDROcodone-acetaminophen (NORCO/VICODIN) 5-325 MG tablet Take 1 tablet by mouth every 4 (four) hours as needed for moderate pain. 10 tablet 0  . lansoprazole (PREVACID) 15 MG capsule Take 15 mg by mouth daily at 12 noon.    . predniSONE (DELTASONE) 5 MG tablet TAKE 1 TABLET BY MOUTH EVERY DAY WITH BREAKFAST 30 tablet 90  . tadalafil (CIALIS) 10 MG tablet Take 10 mg by mouth daily.  99  . valACYclovir (VALTREX) 500 MG tablet Take 500 mg by mouth 2 (two) times daily.    Marland Kitchen ZYTIGA 250 MG tablet TAKE 4 TABLETS BY MOUTH ONCE DAILY AS DIRECTED.  TAKE 1 HOUR BEFORE OR 2 HOURS AFTER A MEAL 120 tablet 10   No current facility-administered medications for this visit.     Allergies: No Known Allergies  Past Medical History, Surgical history, Social history, and Family History updated without any changes.    Lab Results: Lab Results  Component Value Date   WBC 4.6 11/27/2019   HGB 14.1 11/27/2019   HCT 41.6 11/27/2019   MCV 90.6 11/27/2019   PLT 194 11/27/2019     Chemistry      Component Value Date/Time   NA 142 11/27/2019 1002   K 3.3 (L) 11/27/2019 1002   CL 109 11/27/2019 1002   CO2 25 11/27/2019 1002   BUN 15 11/27/2019 1002   CREATININE 0.79 11/27/2019 1002      Component Value Date/Time   CALCIUM 9.5 11/27/2019 1002   ALKPHOS 109 11/27/2019 1002   AST 20 11/27/2019 1002   ALT 22  11/27/2019 1002   BILITOT 0.8 11/27/2019 1002     Results for Frank [name], Frank [name] (MRN [id number]) as of 12/04/2019 09:00  Ref. Range 11/27/2019 10:02  Prostate Specific Ag, Serum Latest Ref Range: 0.0 - 4.0 ng/mL <0.1    Radiological Studies: IMPRESSION: 1. Stable area of focal tracer uptake within the posterior aspect of the eighth left rib. 2. Interval resolution of the foci of tracer activity seen at the costochondral junction of the left sixth rib and left side of the T10 vertebral body on the prior study dated December 06, 2018.   IMPRESSION: 1. No acute findings in the abdomen or pelvis. 2. Small lymph nodes in the retroperitoneum are less than a cm and unchanged. 3. Sclerotic foci in the iliac bone, sacrum and right femur are less conspicuous than on the previous study. 4. Nonobstructive bilateral nephrolithiasis   Impression and Plan:   49 year old man with the:  1. Castration-sensitive  advanced prostate cancer with disease to the bone diagnosed in 2019.  He is currently on Zytiga with excellent PSA response at this time.  CT scan and bone scan obtained on November 27, 2019 were personally reviewed and continues to show complete response.  Risks and benefits of continuing Zytiga and androgen deprivation therapy were reviewed and he is agreeable to continue.  2. Androgen depravation: He is currently receiving Eligard every 6 months under the care of Dr. Jeffie Pollock.  I recommended continuing this indefinitely.  3.Bone directed therapy:  Continue to recommend calcium and vitamin D supplements.  Delton See will be used if he develops worsening metastatic disease.  4. Prognosis: 's disease remains incurable although he is doing really well with current treatment and disease in remission.  Aggressive measures are warranted.  5.    Covid vaccine considerations: We have discussed this in detail and I recommended vaccine for him although encouraged him to continue to wear a mask and continue with social distancing protocols even after the vaccine.  6. Follow-up:  He will return in 4 months for follow-up.      I discussed the assessment and treatment plan with the patient. The patient was provided an opportunity to ask questions and all were answered. The patient agreed with the plan and demonstrated an understanding of the instructions.   The patient was advised to call back or seek an in-person evaluation if the symptoms worsen or if the condition fails to improve as anticipated.  I provided 30  minutes of face-to-face video visit time during this encounter, and > 50% was dedicated to reviewing his disease status, reviewing laboratory data, reviewing imaging studies and discussing his cancer care as well as treatment options.  [name], MD 12/04/2019 9:19 AM

## 2019-12-07 ENCOUNTER — Telehealth: Payer: Self-pay | Admitting: Oncology

## 2019-12-07 NOTE — Telephone Encounter (Signed)
Scheduled appt per 1/12 los.  Sent a message to HIM pool to get a calendar mailed out. 

## 2020-03-25 ENCOUNTER — Other Ambulatory Visit: Payer: Self-pay

## 2020-03-25 DIAGNOSIS — C61 Malignant neoplasm of prostate: Secondary | ICD-10-CM

## 2020-03-26 ENCOUNTER — Inpatient Hospital Stay: Payer: BC Managed Care – PPO

## 2020-03-26 ENCOUNTER — Inpatient Hospital Stay: Payer: BC Managed Care – PPO | Attending: Oncology | Admitting: Oncology

## 2020-03-26 ENCOUNTER — Other Ambulatory Visit: Payer: Self-pay

## 2020-03-26 VITALS — BP 123/81 | HR 89 | Temp 98.5°F | Resp 18 | Ht 69.0 in | Wt 187.4 lb

## 2020-03-26 DIAGNOSIS — E876 Hypokalemia: Secondary | ICD-10-CM | POA: Insufficient documentation

## 2020-03-26 DIAGNOSIS — C61 Malignant neoplasm of prostate: Secondary | ICD-10-CM | POA: Diagnosis not present

## 2020-03-26 DIAGNOSIS — C7951 Secondary malignant neoplasm of bone: Secondary | ICD-10-CM | POA: Diagnosis not present

## 2020-03-26 LAB — CBC WITH DIFFERENTIAL (CANCER CENTER ONLY)
Abs Immature Granulocytes: 0.04 10*3/uL (ref 0.00–0.07)
Basophils Absolute: 0 10*3/uL (ref 0.0–0.1)
Basophils Relative: 1 %
Eosinophils Absolute: 0.2 10*3/uL (ref 0.0–0.5)
Eosinophils Relative: 4 %
HCT: 40.9 % (ref 39.0–52.0)
Hemoglobin: 13.9 g/dL (ref 13.0–17.0)
Immature Granulocytes: 1 %
Lymphocytes Relative: 27 %
Lymphs Abs: 1.5 10*3/uL (ref 0.7–4.0)
MCH: 30.7 pg (ref 26.0–34.0)
MCHC: 34 g/dL (ref 30.0–36.0)
MCV: 90.3 fL (ref 80.0–100.0)
Monocytes Absolute: 0.4 10*3/uL (ref 0.1–1.0)
Monocytes Relative: 7 %
Neutro Abs: 3.2 10*3/uL (ref 1.7–7.7)
Neutrophils Relative %: 60 %
Platelet Count: 191 10*3/uL (ref 150–400)
RBC: 4.53 MIL/uL (ref 4.22–5.81)
RDW: 12 % (ref 11.5–15.5)
WBC Count: 5.4 10*3/uL (ref 4.0–10.5)
nRBC: 0 % (ref 0.0–0.2)

## 2020-03-26 LAB — CMP (CANCER CENTER ONLY)
ALT: 15 U/L (ref 0–44)
AST: 15 U/L (ref 15–41)
Albumin: 4.2 g/dL (ref 3.5–5.0)
Alkaline Phosphatase: 123 U/L (ref 38–126)
Anion gap: 9 (ref 5–15)
BUN: 12 mg/dL (ref 6–20)
CO2: 24 mmol/L (ref 22–32)
Calcium: 9.4 mg/dL (ref 8.9–10.3)
Chloride: 111 mmol/L (ref 98–111)
Creatinine: 0.76 mg/dL (ref 0.61–1.24)
GFR, Est AFR Am: >60 mL/min (ref >60)
GFR, Estimated: >60 mL/min (ref >60)
Glucose, Bld: 124 mg/dL — ABNORMAL HIGH (ref 70–99)
Potassium: 3.4 mmol/L — ABNORMAL LOW (ref 3.5–5.1)
Sodium: 144 mmol/L (ref 135–145)
Total Bilirubin: 0.5 mg/dL (ref 0.3–1.2)
Total Protein: 7.3 g/dL (ref 6.5–8.1)

## 2020-03-26 NOTE — Progress Notes (Signed)
Hematology and Oncology Follow Up   Frank [name] [id number] 1971-06-17 49 y.o. 03/26/2020 10:13 AM [name], MDMorrow, Marjory Lies, MD       Principle Diagnosis: 49 year old man with advanced prostate cancer with disease to the bone diagnosed in 2019.  He presented with castration-sensitive, Gleason score 4+5 = 10 and a PSA of 27.8.  Prior Therapy:  He is status post prostate biopsy March 2019 which confirmed the diagnosis of Gleason score 5+5 = 10 prostate cancer.  Current therapy:  Androgen deprivation currently receiving Lupron under the care of Dr. Jeffie Pollock.  Zytiga 1000 mg prednisone 5 mg daily in April 2019.  Interim History: Frank [name]  presents today for a follow-up.  Since the last visit, he reports no major changes in his health.  He continues to tolerate Zytiga without any complaints.  Denies any nausea, vomiting or abdominal pain.  Denies any weakness or tiredness.  Denies any pathological fractures.   Medications: Updated on review.   Current Outpatient Medications  Medication Sig Dispense Refill  . calcium carbonate (OS-CAL) 600 MG TABS tablet Take 1 tablet (600 mg total) by mouth 2 (two) times daily with a meal. 60 tablet 3  . HYDROcodone-acetaminophen (NORCO/VICODIN) 5-325 MG tablet Take 1 tablet by mouth every 4 (four) hours as needed for moderate pain. 10 tablet 0  . lansoprazole (PREVACID) 15 MG capsule Take 15 mg by mouth daily at 12 noon.    . predniSONE (DELTASONE) 5 MG tablet TAKE 1 TABLET BY MOUTH EVERY DAY WITH BREAKFAST 30 tablet 90  . tadalafil (CIALIS) 10 MG tablet Take 10 mg by mouth daily.  99  . valACYclovir (VALTREX) 500 MG tablet Take 500 mg by mouth 2 (two) times daily.    Marland Kitchen ZYTIGA 250 MG tablet TAKE 4 TABLETS BY MOUTH ONCE DAILY AS DIRECTED.  TAKE 1 HOUR BEFORE OR 2 HOURS AFTER A MEAL 120 tablet 10   No current facility-administered medications for this visit.     Allergies: No Known Allergies Blood pressure 123/81, pulse 89,  temperature 98.5 F (36.9 C), temperature source Temporal, resp. rate 18, height 5\' 9"  (1.753 m), weight 187 lb 6.4 oz (85 kg), SpO2 99 %.    Physical exam:  General appearance: Comfortable appearing without any discomfort Head: Normocephalic without any trauma Oropharynx: Mucous membranes are moist and pink without any thrush or ulcers. Eyes: Pupils are equal and round reactive to light. Lymph nodes: No cervical, supraclavicular, inguinal or axillary lymphadenopathy.   Heart:regular rate and rhythm.  S1 and S2 without leg edema. Lung: Clear without any rhonchi or wheezes.  No dullness to percussion. Abdomin: Soft, nontender, nondistended with good bowel sounds.  No hepatosplenomegaly. Musculoskeletal: No joint deformity or effusion.  Full range of motion noted. Neurological: No deficits noted on motor, sensory and deep tendon reflex exam. Skin: No petechial rash or dryness.  Appeared moist.       Lab Results: Lab Results  Component Value Date   WBC 4.6 11/27/2019   HGB 14.1 11/27/2019   HCT 41.6 11/27/2019   MCV 90.6 11/27/2019   PLT 194 11/27/2019     Chemistry      Component Value Date/Time   NA 142 11/27/2019 1002   K 3.3 (L) 11/27/2019 1002   CL 109 11/27/2019 1002   CO2 25 11/27/2019 1002   BUN 15 11/27/2019 1002   CREATININE 0.79 11/27/2019 1002      Component Value Date/Time   CALCIUM 9.5 11/27/2019 1002   ALKPHOS 109  11/27/2019 1002   AST 20 11/27/2019 1002   ALT 22 11/27/2019 1002   BILITOT 0.8 11/27/2019 1002     Results for Frank [name], Frank [name] (MRN [id number]) as of 12/04/2019 09:00  Ref. Range 11/27/2019 10:02  Prostate Specific Ag, Serum Latest Ref Range: 0.0 - 4.0 ng/mL <0.1      Impression and Plan:   49 year old man with the:  1.  Advanced prostate cancer with disease to the bone since 2019.  He has castration-sensitive  disease at this time.  The natural course of his disease was updated at this time.  He continues to tolerate Zytiga  with excellent PSA response is currently undetectable.  Imaging studies from January 2021 were reiterated again today which showed positive response to therapy.  Complications associated with long-term Zytiga including hypertension, edema hypokalemia were reviewed.  Alternative options such as systemic chemotherapy was also discussed.  At this time I recommended continuing the same dose and schedule and continue to monitor his PSA.  Different salvage therapy may be needed.  Castration-resistant disease.  He is agreeable to proceed with this plan.  2. Androgen depravation: I recommended continuing this indefinitely.  He is receiving it under the care of Dr Karsten Ro every 6 months in the form of Eligard.  He has expressed interest to receive it at the cancer center to consolidate his care.  Next injection will be in June and repeated every 6 months.  3.Bone directed therapy:  I recommended calcium and vitamin D supplements.  The role of Delton See was reiterated at this time for bone protective purposes.  4. Prognosis:  Therapy remains palliative although aggressive measures are warranted given his excellent performance status.  5.    Covid vaccine considerations: He is up-to-date and completed his vaccination series.  6.  Hypokalemia: Related to Zytiga and will monitor closely and replace as needed.  7. Follow-up:  In 4 months for repeat follow-up.   30  minutes were dedicated to this visit. The time was spent on reviewing laboratory data, reviewing disease status, discussing treatment options, addressing complications related to his cancer and cancer therapy and future plan of care.  [name], MD 03/26/2020 10:13 AM

## 2020-03-27 ENCOUNTER — Telehealth: Payer: Self-pay | Admitting: Oncology

## 2020-03-27 ENCOUNTER — Telehealth: Payer: Self-pay

## 2020-03-27 LAB — PROSTATE-SPECIFIC AG, SERUM (LABCORP): Prostate Specific Ag, Serum: <0.1 ng/mL (ref 0.0–4.0)

## 2020-03-27 NOTE — Telephone Encounter (Signed)
Called patient and let him know that per Dr. Alen Blew PSA is still low. Patient verbalized understanding.

## 2020-03-27 NOTE — Telephone Encounter (Signed)
Scheduled appt per 5/5 los.  Left a vm of the appt dates and time

## 2020-03-27 NOTE — Telephone Encounter (Signed)
-----   Message from Wyatt Portela, MD sent at 03/27/2020  8:07 AM EDT ----- Please let him know his PSA is still low

## 2020-04-11 ENCOUNTER — Other Ambulatory Visit: Payer: Self-pay | Admitting: Oncology

## 2020-05-05 ENCOUNTER — Inpatient Hospital Stay: Payer: BC Managed Care – PPO | Attending: Oncology

## 2020-05-05 ENCOUNTER — Other Ambulatory Visit: Payer: Self-pay

## 2020-05-05 VITALS — BP 130/82 | HR 81 | Temp 98.1°F | Resp 18

## 2020-05-05 DIAGNOSIS — Z5111 Encounter for antineoplastic chemotherapy: Secondary | ICD-10-CM | POA: Insufficient documentation

## 2020-05-05 DIAGNOSIS — C61 Malignant neoplasm of prostate: Secondary | ICD-10-CM | POA: Insufficient documentation

## 2020-05-05 MED ORDER — LEUPROLIDE ACETATE (6 MONTH) 45 MG ~~LOC~~ KIT
45.0000 mg | PACK | Freq: Once | SUBCUTANEOUS | Status: AC
  Administered 2020-05-05: 45 mg via SUBCUTANEOUS
  Filled 2020-05-05: qty 45

## 2020-05-29 ENCOUNTER — Other Ambulatory Visit: Payer: Self-pay | Admitting: *Deleted

## 2020-05-29 DIAGNOSIS — C61 Malignant neoplasm of prostate: Secondary | ICD-10-CM

## 2020-05-29 MED ORDER — ABIRATERONE ACETATE 250 MG PO TABS: ORAL_TABLET | ORAL | 10 refills | Status: DC

## 2020-05-29 NOTE — Telephone Encounter (Signed)
Received faxed refill request for [facility] Per Dr. Hazeline Junker office visit note 03/26/20: "He continues to tolerate Zytiga with excellent PSA response is currently undetectable. Marland KitchenMarland KitchenMarland KitchenAt this time I recommended continuing the same dose and schedule and continue to monitor his PSA"  Refill sent electronically to [facility]

## 2020-07-17 DIAGNOSIS — C61 Malignant neoplasm of prostate: Secondary | ICD-10-CM | POA: Diagnosis not present

## 2020-07-25 ENCOUNTER — Inpatient Hospital Stay: Payer: BC Managed Care – PPO

## 2020-07-25 ENCOUNTER — Inpatient Hospital Stay: Payer: BC Managed Care – PPO | Attending: Oncology | Admitting: Oncology

## 2020-07-25 ENCOUNTER — Other Ambulatory Visit: Payer: Self-pay

## 2020-07-25 VITALS — BP 130/87 | HR 84 | Temp 97.8°F | Resp 18 | Ht 69.0 in | Wt 181.4 lb

## 2020-07-25 DIAGNOSIS — E876 Hypokalemia: Secondary | ICD-10-CM | POA: Insufficient documentation

## 2020-07-25 DIAGNOSIS — C7951 Secondary malignant neoplasm of bone: Secondary | ICD-10-CM | POA: Insufficient documentation

## 2020-07-25 DIAGNOSIS — Z79890 Hormone replacement therapy (postmenopausal): Secondary | ICD-10-CM | POA: Diagnosis not present

## 2020-07-25 DIAGNOSIS — C61 Malignant neoplasm of prostate: Secondary | ICD-10-CM | POA: Diagnosis not present

## 2020-07-25 LAB — CMP (CANCER CENTER ONLY)
ALT: 12 U/L (ref 0–44)
AST: 13 U/L — ABNORMAL LOW (ref 15–41)
Albumin: 4.2 g/dL (ref 3.5–5.0)
Alkaline Phosphatase: 135 U/L — ABNORMAL HIGH (ref 38–126)
Anion gap: 8 (ref 5–15)
BUN: 12 mg/dL (ref 6–20)
CO2: 27 mmol/L (ref 22–32)
Calcium: 9.7 mg/dL (ref 8.9–10.3)
Chloride: 108 mmol/L (ref 98–111)
Creatinine: 0.82 mg/dL (ref 0.61–1.24)
GFR, Est AFR Am: >60 mL/min (ref >60)
GFR, Estimated: >60 mL/min (ref >60)
Glucose, Bld: 101 mg/dL — ABNORMAL HIGH (ref 70–99)
Potassium: 3.9 mmol/L (ref 3.5–5.1)
Sodium: 143 mmol/L (ref 135–145)
Total Bilirubin: 0.5 mg/dL (ref 0.3–1.2)
Total Protein: 7.3 g/dL (ref 6.5–8.1)

## 2020-07-25 LAB — CBC WITH DIFFERENTIAL (CANCER CENTER ONLY)
Abs Immature Granulocytes: 0.02 10*3/uL (ref 0.00–0.07)
Basophils Absolute: 0.1 10*3/uL (ref 0.0–0.1)
Basophils Relative: 1 %
Eosinophils Absolute: 0.2 10*3/uL (ref 0.0–0.5)
Eosinophils Relative: 5 %
HCT: 42.9 % (ref 39.0–52.0)
Hemoglobin: 14.3 g/dL (ref 13.0–17.0)
Immature Granulocytes: 0 %
Lymphocytes Relative: 30 %
Lymphs Abs: 1.3 10*3/uL (ref 0.7–4.0)
MCH: 30.4 pg (ref 26.0–34.0)
MCHC: 33.3 g/dL (ref 30.0–36.0)
MCV: 91.3 fL (ref 80.0–100.0)
Monocytes Absolute: 0.3 10*3/uL (ref 0.1–1.0)
Monocytes Relative: 7 %
Neutro Abs: 2.5 10*3/uL (ref 1.7–7.7)
Neutrophils Relative %: 57 %
Platelet Count: 180 10*3/uL (ref 150–400)
RBC: 4.7 MIL/uL (ref 4.22–5.81)
RDW: 12.1 % (ref 11.5–15.5)
WBC Count: 4.5 10*3/uL (ref 4.0–10.5)
nRBC: 0 % (ref 0.0–0.2)

## 2020-07-25 NOTE — Progress Notes (Signed)
Hematology and Oncology Follow Up   Frank [name] [id number] [birth year] 49 y.o. 07/25/2020 9:33 AM Frank [name], MDMorrow, Frank Lies, MD       Principle Diagnosis: 49 year old man with castration-sensitive advanced prostate cancer with [name] to the bone diagnosed in 2019.  He he was found to have Gleason score 4+5 = 10 and a PSA of 27.8 at the time of diagnosis.  Prior Therapy:  He is status post prostate biopsy March 2019 which confirmed the diagnosis of Gleason score 5+5 = 10 prostate cancer.  Current therapy:  Eligard 45 mg every 6 months.  Next injection will be after November 04, 2020.  Zytiga 1000 mg prednisone 5 mg daily in April 2019.  Interim History: Frank [name]  is here for a follow-up [name].  Since last visit, he reports no major changes in his health.  He denies any excessive fatigue, tiredness or bone pain.  He denies any pathologic fractures or excessive fatigue.  He has tolerated Zytiga without any new complaints.  Continues to work and play golf periodically.   Medications: Reviewed without changes. Current Outpatient Medications  Medication Sig Dispense Refill  . abiraterone acetate (ZYTIGA) 250 MG tablet TAKE 4 TABLETS BY MOUTH ONCE DAILY AS DIRECTED.  TAKE 1 HOUR BEFORE OR 2 HOURS AFTER A MEAL 120 tablet 10  . [facility] CALCIUM 600 MG tablet Take 1 tablet by mouth 2 (two) times daily.    Marland Kitchen HYDROcodone-acetaminophen (NORCO/VICODIN) 5-325 MG tablet Take 1 tablet by mouth every 4 (four) hours as needed for moderate pain. 10 tablet 0  . lansoprazole (PREVACID) 15 MG capsule Take 15 mg by mouth daily at 12 noon.    . predniSONE (DELTASONE) 5 MG tablet TAKE 1 TABLET BY MOUTH EVERY DAY WITH BREAKFAST 30 tablet 90  . tadalafil (CIALIS) 10 MG tablet Take 10 mg by mouth daily.  99  . valACYclovir (VALTREX) 500 MG tablet Take 500 mg by mouth 2 (two) times daily.     No current facility-administered medications for this visit.     Allergies: No Known  Allergies    Physical exam:  Blood pressure 130/87, pulse 84, temperature 97.8 F (36.6 C), temperature source Tympanic, resp. rate 18, height 5\' 9"  (1.753 m), weight 181 lb 6.4 oz (82.3 kg), SpO2 100 %.   ECOG 0   General appearance: Alert, awake without any distress. Head: Atraumatic without abnormalities Oropharynx: Without any thrush or ulcers. Eyes: No scleral icterus. Lymph nodes: No lymphadenopathy noted in the cervical, supraclavicular, or axillary nodes Heart:regular rate and rhythm, without any murmurs or gallops.   Lung: Clear to auscultation without any rhonchi, wheezes or dullness to percussion. Abdomin: Soft, nontender without any shifting dullness or ascites. Musculoskeletal: No clubbing or cyanosis. Neurological: No motor or sensory deficits. Skin: No rashes or lesions.       Lab Results: Lab Results  Component Value Date   WBC 4.5 07/25/2020   HGB 14.3 07/25/2020   HCT 42.9 07/25/2020   MCV 91.3 07/25/2020   PLT 180 07/25/2020     Chemistry      Component Value Date/Time   NA 144 03/26/2020 1003   K 3.4 (L) 03/26/2020 1003   CL 111 03/26/2020 1003   CO2 24 03/26/2020 1003   BUN 12 03/26/2020 1003   CREATININE 0.76 03/26/2020 1003      Component Value Date/Time   CALCIUM 9.4 03/26/2020 1003   ALKPHOS 123 03/26/2020 1003   AST 15 03/26/2020 1003   ALT 15 03/26/2020  1003   BILITOT 0.5 03/26/2020 1003       Results for Frank, [name] (MRN [id number]) as of 07/25/2020 09:35  Ref. Range 11/27/2019 10:02 03/26/2020 10:03  Prostate Specific Ag, Serum Latest Ref Range: 0.0 - 4.0 ng/mL <0.1 <0.1     Impression and Plan:   49 year old man with the:  1.  Castration-sensitive prostate cancer with [name] to the bone diagnosed in 2019.   He is currently on Zytiga in addition to androgen deprivation with a PSA currently undetectable.  Staging work-up completed in January 2021 showed almost complete response to therapy and has been experience  excellent tolerance.  Risks and benefits of continuing this therapy were reviewed at this time.  Alternative options such as systemic chemotherapy among others will be deferred if he has castration-resistant [name].  He is agreeable to proceed at this time we will update his staging in January 2021.    2. Androgen depravation: He is currently on Eligard every 6 months.  Next injection will be given after December 14.  3.Bone directed therapy:  Risks and benefits of Xgeva were reviewed at this time.  Potential complications including hypocalcemia osteonecrosis of the jaw given his limited bone [name] is reasonably able to defer this option for the time being.  4. Prognosis: 's [name] is incurable although aggressive measures are warranted.  5.    Covid vaccine considerations: Risks and benefits of Covid vaccine booster were discussed today.  He does have ongoing cancer and cancer treatment options and is unclear how his immune response have handled initial vaccination series.  He will consider that and decide on it in the near future.  6.  Hypokalemia: Related to Zytiga and will be replaced as needed.  7. Follow-up: He will return in 4 months after repeating staging work-up.   30  minutes were spent on this encounter.  The time was dedicated to updating his [name] status, discussing treatment options and future plan of care with review.  Frank Button, MD 07/25/2020 9:33 AM

## 2020-07-26 LAB — PROSTATE-SPECIFIC AG, SERUM (LABCORP): Prostate Specific Ag, Serum: <0.1 ng/mL (ref 0.0–4.0)

## 2020-07-29 ENCOUNTER — Telehealth: Payer: Self-pay

## 2020-07-29 NOTE — Telephone Encounter (Signed)
-----   Message from Wyatt Portela, MD sent at 07/29/2020  8:16 AM EDT ----- Please let him know his PSA continues to be very low.  No changes at this time.

## 2020-07-29 NOTE — Telephone Encounter (Signed)
Called patient with PSA results per Dr Alen Blew. Patient verbalized understanding.

## 2020-08-18 ENCOUNTER — Other Ambulatory Visit: Payer: Self-pay | Admitting: Oncology

## 2020-08-24 DIAGNOSIS — Z20822 Contact with and (suspected) exposure to covid-19: Secondary | ICD-10-CM | POA: Diagnosis not present

## 2020-08-24 DIAGNOSIS — Z03818 Encounter for observation for suspected exposure to other biological agents ruled out: Secondary | ICD-10-CM | POA: Diagnosis not present

## 2020-08-25 DIAGNOSIS — Z03818 Encounter for observation for suspected exposure to other biological agents ruled out: Secondary | ICD-10-CM | POA: Diagnosis not present

## 2020-08-25 DIAGNOSIS — R059 Cough, unspecified: Secondary | ICD-10-CM | POA: Diagnosis not present

## 2020-08-25 DIAGNOSIS — J069 Acute upper respiratory infection, unspecified: Secondary | ICD-10-CM | POA: Diagnosis not present

## 2020-10-29 ENCOUNTER — Other Ambulatory Visit: Payer: Self-pay

## 2020-10-29 ENCOUNTER — Inpatient Hospital Stay: Payer: BC Managed Care – PPO | Attending: Oncology

## 2020-10-29 VITALS — BP 132/78 | HR 82 | Temp 98.2°F | Resp 18

## 2020-10-29 DIAGNOSIS — C61 Malignant neoplasm of prostate: Secondary | ICD-10-CM

## 2020-10-29 DIAGNOSIS — Z5111 Encounter for antineoplastic chemotherapy: Secondary | ICD-10-CM | POA: Diagnosis not present

## 2020-10-29 MED ORDER — LEUPROLIDE ACETATE (6 MONTH) 45 MG ~~LOC~~ KIT
45.0000 mg | PACK | Freq: Once | SUBCUTANEOUS | Status: AC
  Administered 2020-10-29: 45 mg via SUBCUTANEOUS
  Filled 2020-10-29: qty 45

## 2020-10-29 NOTE — Patient Instructions (Signed)

## 2020-11-19 ENCOUNTER — Telehealth: Payer: Self-pay

## 2020-11-19 NOTE — Telephone Encounter (Signed)
Patient called to let me know that he had been auto re-enrolled in [facility] for Zytiga at no cost 11/22/20-11/21/21.  [facility] phone [phone]  [facility] Phone [phone] Fax [phone] 11/19/2020 11:13 AM

## 2020-11-26 ENCOUNTER — Other Ambulatory Visit: Payer: Self-pay

## 2020-11-26 ENCOUNTER — Inpatient Hospital Stay: Payer: BC Managed Care – PPO | Attending: Oncology

## 2020-11-26 ENCOUNTER — Ambulatory Visit (HOSPITAL_COMMUNITY)
Admission: RE | Admit: 2020-11-26 | Discharge: 2020-11-26 | Disposition: A | Discharge status: Home / Self Care | Payer: BC Managed Care – PPO | Source: Ambulatory Visit | Attending: Oncology | Admitting: Oncology

## 2020-11-26 DIAGNOSIS — C61 Malignant neoplasm of prostate: Secondary | ICD-10-CM | POA: Insufficient documentation

## 2020-11-26 DIAGNOSIS — C7951 Secondary malignant neoplasm of bone: Secondary | ICD-10-CM | POA: Diagnosis not present

## 2020-11-26 DIAGNOSIS — E876 Hypokalemia: Secondary | ICD-10-CM | POA: Diagnosis not present

## 2020-11-26 DIAGNOSIS — N200 Calculus of kidney: Secondary | ICD-10-CM | POA: Diagnosis not present

## 2020-11-26 LAB — CBC WITH DIFFERENTIAL (CANCER CENTER ONLY)
Abs Immature Granulocytes: 0.04 10*3/uL (ref 0.00–0.07)
Basophils Absolute: 0 10*3/uL (ref 0.0–0.1)
Basophils Relative: 1 %
Eosinophils Absolute: 0.2 10*3/uL (ref 0.0–0.5)
Eosinophils Relative: 3 %
HCT: 42.4 % (ref 39.0–52.0)
Hemoglobin: 14.3 g/dL (ref 13.0–17.0)
Immature Granulocytes: 1 %
Lymphocytes Relative: 21 %
Lymphs Abs: 1.2 10*3/uL (ref 0.7–4.0)
MCH: 30.5 pg (ref 26.0–34.0)
MCHC: 33.7 g/dL (ref 30.0–36.0)
MCV: 90.4 fL (ref 80.0–100.0)
Monocytes Absolute: 0.5 10*3/uL (ref 0.1–1.0)
Monocytes Relative: 9 %
Neutro Abs: 3.8 10*3/uL (ref 1.7–7.7)
Neutrophils Relative %: 65 %
Platelet Count: 192 10*3/uL (ref 150–400)
RBC: 4.69 MIL/uL (ref 4.22–5.81)
RDW: 12.3 % (ref 11.5–15.5)
WBC Count: 5.8 10*3/uL (ref 4.0–10.5)
nRBC: 0 % (ref 0.0–0.2)

## 2020-11-26 LAB — CMP (CANCER CENTER ONLY)
ALT: 19 U/L (ref 0–44)
AST: 15 U/L (ref 15–41)
Albumin: 4.4 g/dL (ref 3.5–5.0)
Alkaline Phosphatase: 119 U/L (ref 38–126)
Anion gap: 10 (ref 5–15)
BUN: 13 mg/dL (ref 6–20)
CO2: 26 mmol/L (ref 22–32)
Calcium: 9.8 mg/dL (ref 8.9–10.3)
Chloride: 108 mmol/L (ref 98–111)
Creatinine: 0.81 mg/dL (ref 0.61–1.24)
GFR, Estimated: >60 mL/min (ref >60)
Glucose, Bld: 102 mg/dL — ABNORMAL HIGH (ref 70–99)
Potassium: 3.9 mmol/L (ref 3.5–5.1)
Sodium: 144 mmol/L (ref 135–145)
Total Bilirubin: 0.5 mg/dL (ref 0.3–1.2)
Total Protein: 7.6 g/dL (ref 6.5–8.1)

## 2020-11-27 LAB — PROSTATE-SPECIFIC AG, SERUM (LABCORP): Prostate Specific Ag, Serum: <0.1 ng/mL (ref 0.0–4.0)

## 2020-11-28 ENCOUNTER — Other Ambulatory Visit: Payer: Self-pay

## 2020-11-28 ENCOUNTER — Encounter (HOSPITAL_COMMUNITY)
Admission: RE | Admit: 2020-11-28 | Discharge: 2020-11-28 | Disposition: A | Discharge status: Home / Self Care | Payer: BC Managed Care – PPO | Source: Ambulatory Visit | Attending: Oncology | Admitting: Oncology

## 2020-11-28 DIAGNOSIS — C61 Malignant neoplasm of prostate: Secondary | ICD-10-CM | POA: Diagnosis not present

## 2020-11-28 MED ORDER — TECHNETIUM TC 99M MEDRONATE IV KIT
20.7000 | PACK | Freq: Once | INTRAVENOUS | Status: AC | PRN
  Administered 2020-11-28: 20.7 via INTRAVENOUS

## 2020-12-03 ENCOUNTER — Inpatient Hospital Stay: Payer: BC Managed Care – PPO | Admitting: Oncology

## 2020-12-03 ENCOUNTER — Other Ambulatory Visit: Payer: Self-pay

## 2020-12-03 VITALS — BP 131/83 | HR 86 | Temp 97.8°F | Resp 18 | Ht 69.0 in | Wt 184.7 lb

## 2020-12-03 DIAGNOSIS — B009 Herpesviral infection, unspecified: Secondary | ICD-10-CM | POA: Diagnosis not present

## 2020-12-03 DIAGNOSIS — C61 Malignant neoplasm of prostate: Secondary | ICD-10-CM | POA: Diagnosis not present

## 2020-12-03 DIAGNOSIS — E876 Hypokalemia: Secondary | ICD-10-CM | POA: Diagnosis not present

## 2020-12-03 DIAGNOSIS — C7951 Secondary malignant neoplasm of bone: Secondary | ICD-10-CM | POA: Diagnosis not present

## 2020-12-03 NOTE — Progress Notes (Signed)
Hematology and Oncology Follow Up   Frank [name] [id number] 10-18-1971 50 y.o. 12/03/2020 1:21 PM Frank [name], MDMorrow, Marjory Lies, MD       Principle Diagnosis: 50 year old man with advanced prostate cancer with disease to the bone diagnosed in 2019.  He presented with castration-sensitive disease,, Gleason score 4+5 = 10 and a PSA of 27.8.  Prior Therapy:  He is status post prostate biopsy March 2019 which confirmed the diagnosis of Gleason score 5+5 = 10 prostate cancer.  Current therapy:  Eligard 45 mg every 6 months.  Next injection will be given in June 2022.  Zytiga 1000 mg prednisone 5 mg daily in April 2019.  Interim History: Frank [name]  is here for a follow-up visit.  Since the last visit, he reports no major changes in his health.  He continues to tolerate Zytiga without any complaints.  He denies any nausea, vomiting or abdominal pain.  He denies any recent hospitalization or illnesses.  Performance status quality of life remain excellent.   Medications: Reviewed without changes. Current Outpatient Medications  Medication Sig Dispense Refill  . abiraterone acetate (ZYTIGA) 250 MG tablet TAKE 4 TABLETS BY MOUTH ONCE DAILY AS DIRECTED.  TAKE 1 HOUR BEFORE OR 2 HOURS AFTER A MEAL 120 tablet 10  . calcium carbonate (OS-CAL) 600 MG TABS tablet TAKE 1 TABLET BY MOUTH TWICE A DAY WITH A MEAL 150 tablet 3  . [facility] CALCIUM 600 MG tablet Take 1 tablet by mouth 2 (two) times daily.    . lansoprazole (PREVACID) 15 MG capsule Take 15 mg by mouth daily at 12 noon.    . predniSONE (DELTASONE) 5 MG tablet TAKE 1 TABLET BY MOUTH EVERY DAY WITH BREAKFAST 30 tablet 90  . valACYclovir (VALTREX) 500 MG tablet Take 500 mg by mouth 2 (two) times daily.     No current facility-administered medications for this visit.     Allergies: No Known Allergies    Physical exam:  Blood pressure 131/83, pulse 86, temperature 97.8 F (36.6 C), temperature source Tympanic, resp. rate 18,  height 5\' 9"  (1.753 m), weight 184 lb 11.2 oz (83.8 kg), SpO2 98 %.   ECOG 0   General appearance: Comfortable appearing without any discomfort Head: Normocephalic without any trauma Oropharynx: Mucous membranes are moist and pink without any thrush or ulcers. Eyes: Pupils are equal and round reactive to light. Lymph nodes: No cervical, supraclavicular, inguinal or axillary lymphadenopathy.   Heart:regular rate and rhythm.  S1 and S2 without leg edema. Lung: Clear without any rhonchi or wheezes.  No dullness to percussion. Abdomin: Soft, nontender, nondistended with good bowel sounds.  No hepatosplenomegaly. Musculoskeletal: No joint deformity or effusion.  Full range of motion noted. Neurological: No deficits noted on motor, sensory and deep tendon reflex exam. Skin: No petechial rash or dryness.  Appeared moist.         Lab Results: Lab Results  Component Value Date   WBC 5.8 11/26/2020   HGB 14.3 11/26/2020   HCT 42.4 11/26/2020   MCV 90.4 11/26/2020   PLT 192 11/26/2020     Chemistry      Component Value Date/Time   NA 144 11/26/2020 1103   K 3.9 11/26/2020 1103   CL 108 11/26/2020 1103   CO2 26 11/26/2020 1103   BUN 13 11/26/2020 1103   CREATININE 0.81 11/26/2020 1103      Component Value Date/Time   CALCIUM 9.8 11/26/2020 1103   ALKPHOS 119 11/26/2020 1103   AST 15  11/26/2020 1103   ALT 19 11/26/2020 1103   BILITOT 0.5 11/26/2020 1103       Results for Frank [name] (MRN [id number]) as of 07/25/2020 09:35  Ref. Range 11/27/2019 10:02 03/26/2020 10:03  Prostate Specific Ag, Serum Latest Ref Range: 0.0 - 4.0 ng/mL <0.1 <0.1     Impression and Plan:   50 year old man with the:  1.  Advanced prostate cancer with disease to the bone diagnosed in 2019.  He has castration-sensitive at this time.  The natural course of this disease was reviewed and treatment options were discussed.  Imaging studies including CT scan and bone scan as well as laboratory  data from November 26, 2020 was personally reviewed and discussed with him today.  He has no evidence of disease progression with PSA continues to be undetectable.  He has findings indicating complete response to therapy.    Risks and benefits of continuing Zytiga long-term were discussed.  Complication clinic hypertension, hypokalemia and adrenal insufficiency were reiterated.  At this time he is agreeable to continue and there is always therapy options will be deferred unless he has progression of disease.     2. Androgen depravation: Next Eligard will be given in June 2022.  Long-term complications including weight gain, osteoporosis and sexual dysfunction.  3.Bone directed therapy:  I recommended continuing calcium and vitamin D supplement at this time.  Delton See has been deferred based on his preference.  4. Goals of care: Therapy remains palliative although this measures are warranted given his excellent performance status.  5.    Covid vaccine considerations: I urged him to get his vaccination booster which she will schedule in the near future.  6.  Hypokalemia: No issues reported on Zytiga.  7. Follow-up: In 5 months for repeat evaluation.   30  minutes were dedicated to this visit.  The time was spent on reviewing his disease status, reviewing imaging studies, laboratory data discussion and future plan of care review.  [name], MD 12/03/2020 1:21 PM

## 2020-12-19 ENCOUNTER — Encounter: Payer: Self-pay | Admitting: Genetic Counselor

## 2020-12-22 IMAGING — CT CT ABD-PELV W/O CM
2 of 4 series · 16 of 46 positions shown, 18 images · non-contrast
Comparison: 12/12/2018 bone scan of the same date.

CLINICAL DATA: Post systemic therapy, assess treatment response.

EXAM:
CT ABDOMEN AND PELVIS WITHOUT CONTRAST
TECHNIQUE: Multidetector CT imaging of the abdomen and pelvis was performed
following the standard protocol without IV contrast.

[Series 2: axial st · axial · 0.78mm/px · z∈[+922,+1322]mm · 13 of 90 slices shown, 15 images]
[im 5/90  soft-tissue]
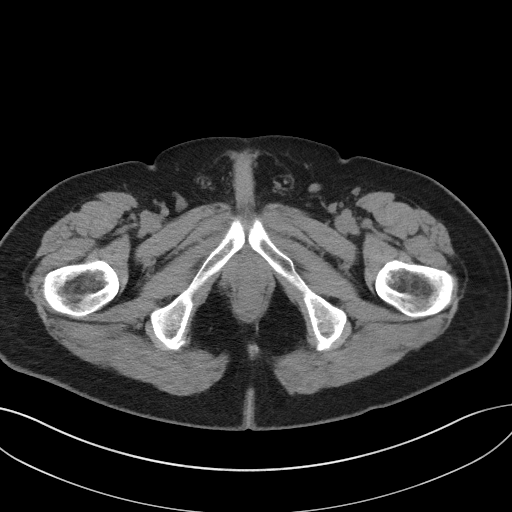
[im 5/90  bone]
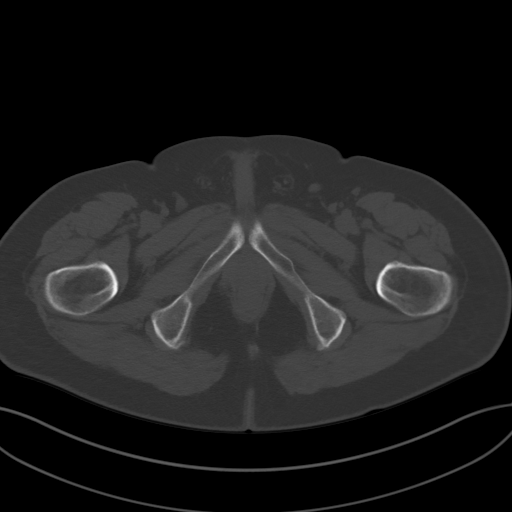
[im 10/90  soft-tissue]
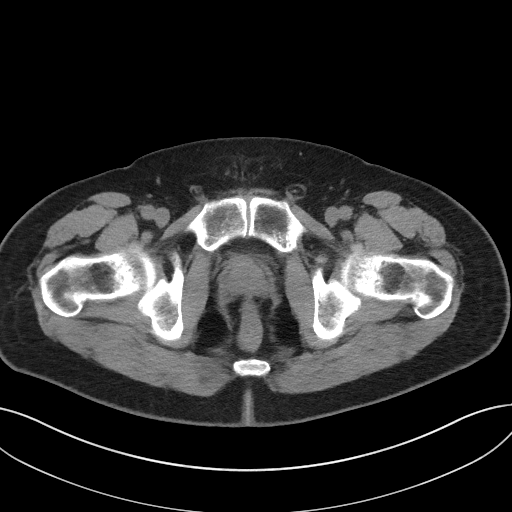
[im 20/90  soft-tissue]
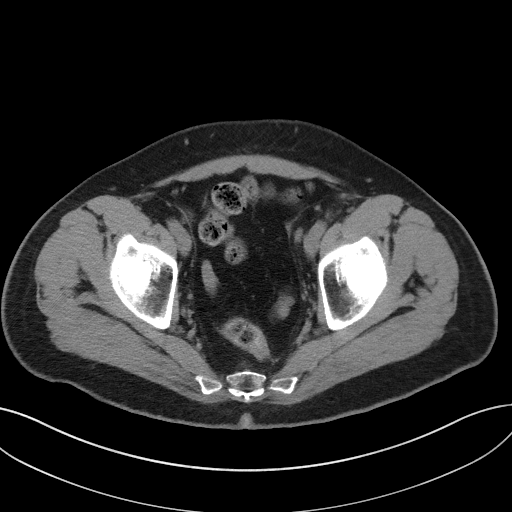
[im 25/90  soft-tissue]
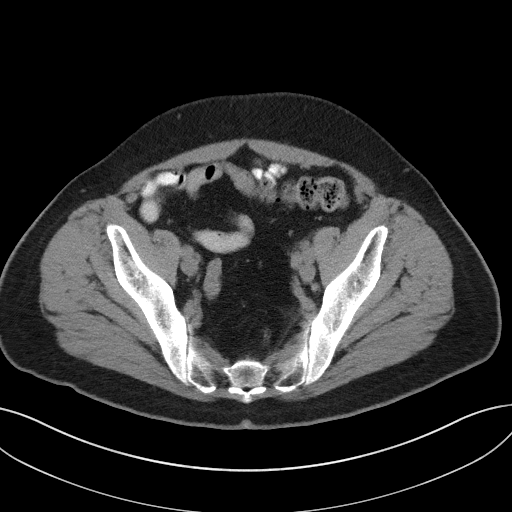
[im 30/90  soft-tissue]
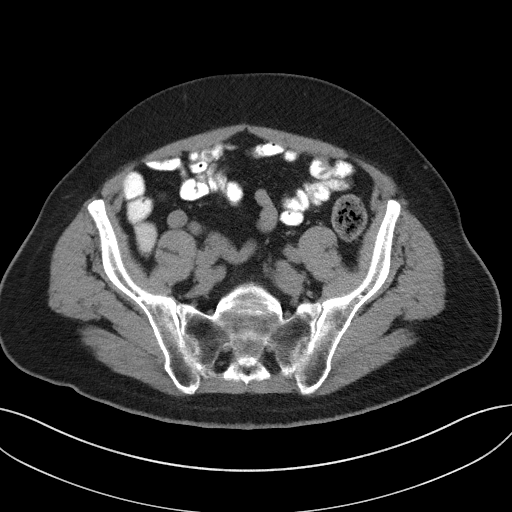
[im 40/90  soft-tissue]
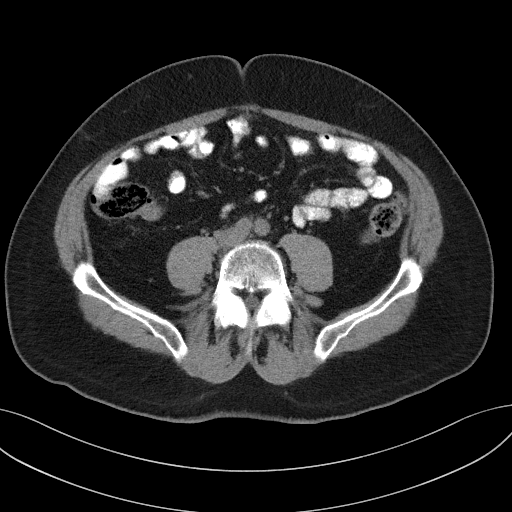
[im 45/90  soft-tissue]
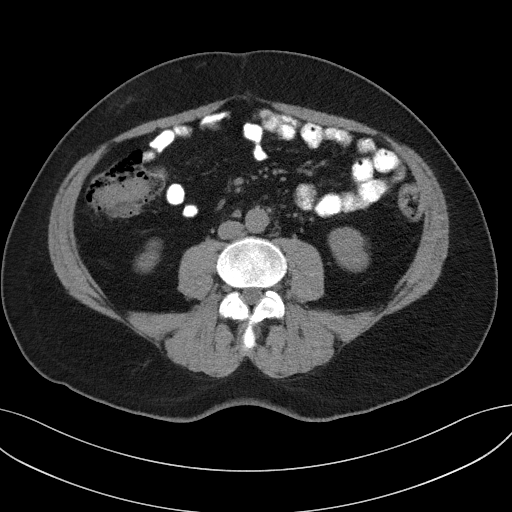
[im 50/90  soft-tissue]
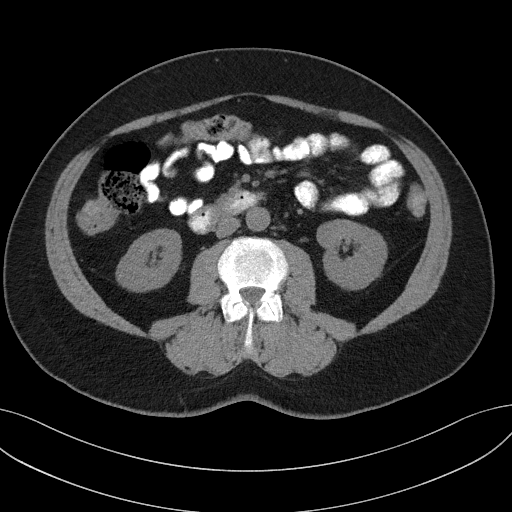
[im 60/90  soft-tissue]
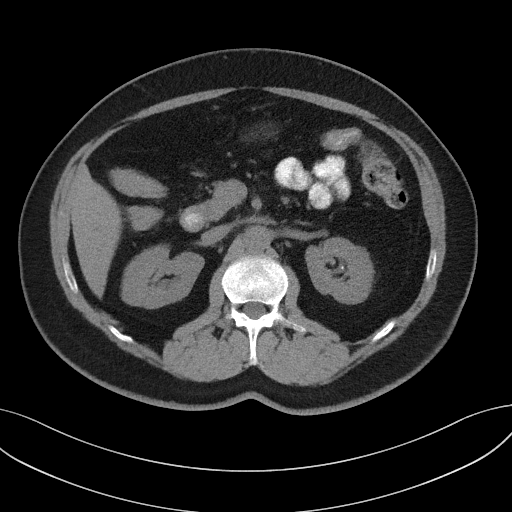
[im 60/90  bone]
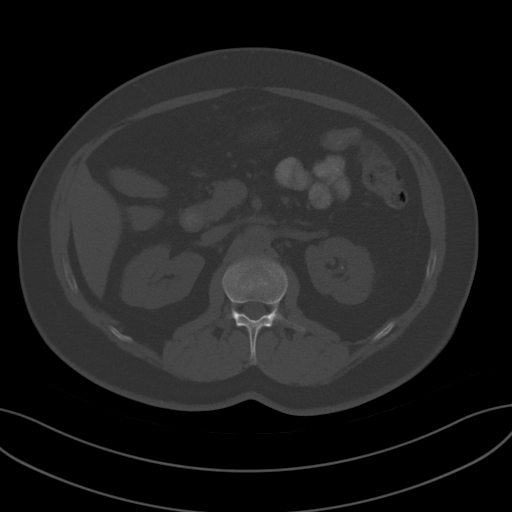
[im 65/90  soft-tissue]
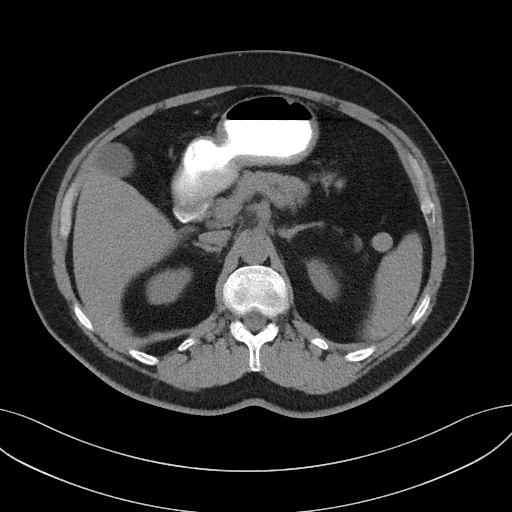
[im 70/90  soft-tissue]
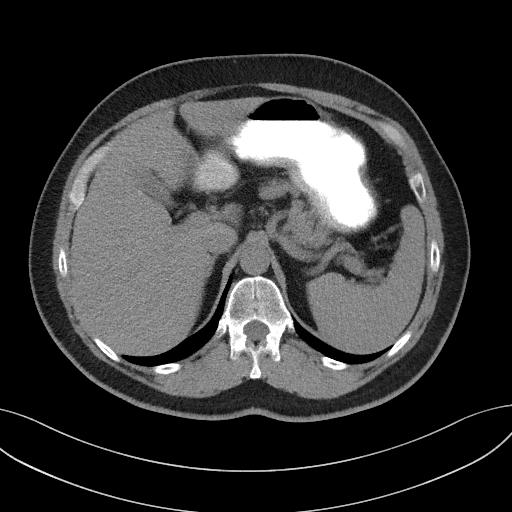
[im 80/90  soft-tissue]
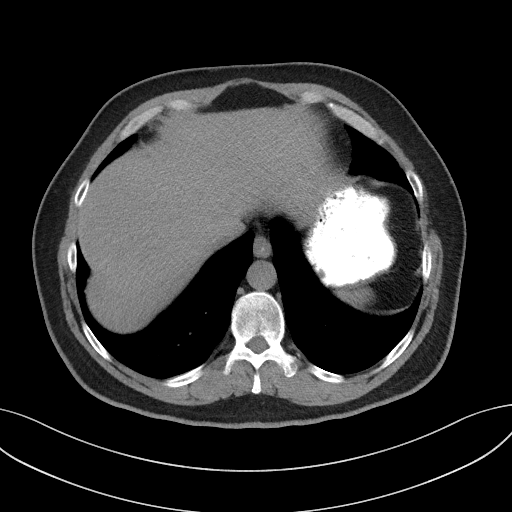
[im 85/90  soft-tissue]
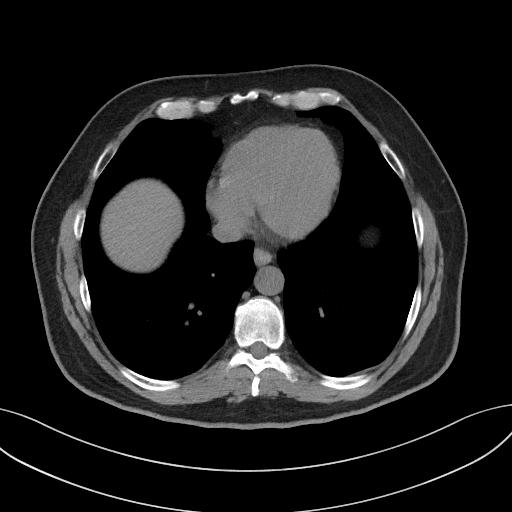

[Series 5: coronal st · coronal · 0.84mm/px · 3 of 104 slices shown]
[im 35/104  soft-tissue]
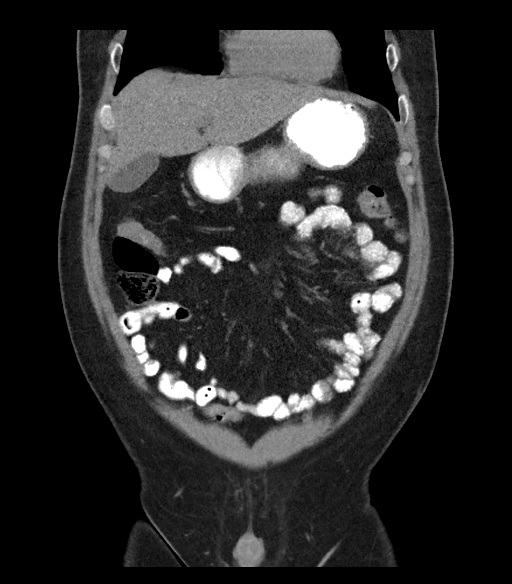
[im 46/104  soft-tissue]
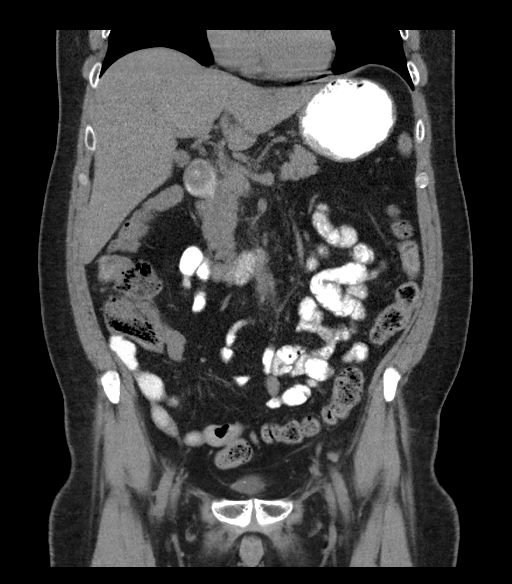
[im 58/104  soft-tissue]
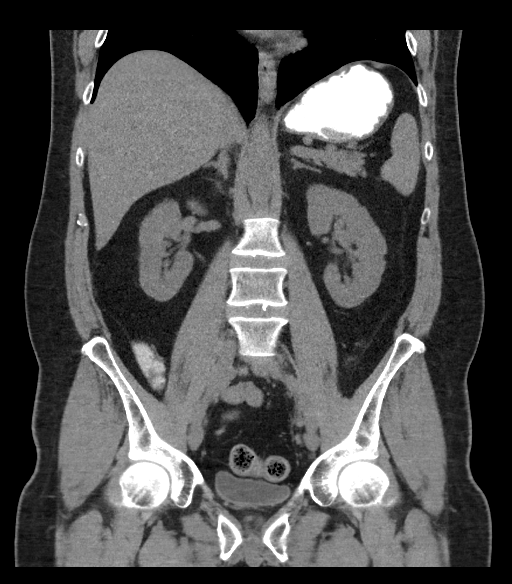

[16 of 46 positions shown; findings below may reference images not displayed]

FINDINGS: Lower chest: Incidental imaging of the lung bases is normal.

Hepatobiliary: Normal appearance of the liver and gallbladder.

Pancreas: Normal appearance of the pancreas.

Spleen: Spleen is unremarkable.

Adrenals/Urinary Tract: Adrenal glands are normal.

Renal contours with mild scarring. Nephrolithiasis, 2-3 mm calculi,
2 in the upper pole on the left and a third in the lower pole. At
least 2 punctate calcifications in the collecting systems the right
kidney. No signs of hydronephrosis.

Stomach/Bowel: No signs of acute gastrointestinal process. Appendix
is normal. Small bowel is filled with positive enteric contrast
throughout much of its course.

Vascular/Lymphatic: Vascular structures not well assessed given lack
of intravenous contrast. No signs of upper abdominal or
retroperitoneal lymphadenopathy. Previously enlarged retroperitoneal
lymph nodes and pelvic lymph nodes continue to decrease in size.

For instance a 7 mm lymph node in the left hemipelvis previously
approximately 8 mm, bulky adenopathy seen in this location on more
remote prior exam.

No signs of pelvic lymphadenopathy.

Reproductive: Prostate is unchanged, unremarkable on noncontrast CT.

Other: No sign of significant hernia or fluid collection.

Musculoskeletal: Tiny sclerotic foci in the spine. Small sclerotic
focus in the left iliac wing approximately 1 cm less conspicuous
than on the prior study with similar size. Sacral lesion is less
conspicuous as is the right femoral lesion with similar size
compared to previous exam.
IMPRESSION: 1. No acute findings in the abdomen or pelvis.
2. Small lymph nodes in the retroperitoneum are less than a cm and
unchanged.
3. Sclerotic foci in the iliac bone, sacrum and right femur are less
conspicuous than on the previous study.
4. Nonobstructive bilateral nephrolithiasis.

## 2021-04-30 ENCOUNTER — Inpatient Hospital Stay (HOSPITAL_BASED_OUTPATIENT_CLINIC_OR_DEPARTMENT_OTHER): Payer: BC Managed Care – PPO | Admitting: Oncology

## 2021-04-30 ENCOUNTER — Inpatient Hospital Stay: Payer: BC Managed Care – PPO

## 2021-04-30 ENCOUNTER — Inpatient Hospital Stay: Payer: BC Managed Care – PPO | Attending: Oncology

## 2021-04-30 ENCOUNTER — Other Ambulatory Visit: Payer: Self-pay

## 2021-04-30 VITALS — BP 136/92 | HR 90 | Temp 97.2°F | Resp 18 | Ht 69.0 in | Wt 189.5 lb

## 2021-04-30 DIAGNOSIS — Z5111 Encounter for antineoplastic chemotherapy: Secondary | ICD-10-CM | POA: Diagnosis not present

## 2021-04-30 DIAGNOSIS — C61 Malignant neoplasm of prostate: Secondary | ICD-10-CM | POA: Insufficient documentation

## 2021-04-30 DIAGNOSIS — E876 Hypokalemia: Secondary | ICD-10-CM | POA: Diagnosis not present

## 2021-04-30 LAB — CMP (CANCER CENTER ONLY)
ALT: 17 U/L (ref 0–44)
AST: 14 U/L — ABNORMAL LOW (ref 15–41)
Albumin: 4.2 g/dL (ref 3.5–5.0)
Alkaline Phosphatase: 120 U/L (ref 38–126)
Anion gap: 11 (ref 5–15)
BUN: 13 mg/dL (ref 6–20)
CO2: 23 mmol/L (ref 22–32)
Calcium: 9.6 mg/dL (ref 8.9–10.3)
Chloride: 109 mmol/L (ref 98–111)
Creatinine: 0.82 mg/dL (ref 0.61–1.24)
GFR, Estimated: >60 mL/min (ref >60)
Glucose, Bld: 131 mg/dL — ABNORMAL HIGH (ref 70–99)
Potassium: 3.4 mmol/L — ABNORMAL LOW (ref 3.5–5.1)
Sodium: 143 mmol/L (ref 135–145)
Total Bilirubin: 0.6 mg/dL (ref 0.3–1.2)
Total Protein: 7.3 g/dL (ref 6.5–8.1)

## 2021-04-30 LAB — CBC WITH DIFFERENTIAL (CANCER CENTER ONLY)
Abs Immature Granulocytes: 0.04 10*3/uL (ref 0.00–0.07)
Basophils Absolute: 0.1 10*3/uL (ref 0.0–0.1)
Basophils Relative: 1 %
Eosinophils Absolute: 0.3 10*3/uL (ref 0.0–0.5)
Eosinophils Relative: 6 %
HCT: 43.2 % (ref 39.0–52.0)
Hemoglobin: 14.6 g/dL (ref 13.0–17.0)
Immature Granulocytes: 1 %
Lymphocytes Relative: 32 %
Lymphs Abs: 1.6 10*3/uL (ref 0.7–4.0)
MCH: 30.5 pg (ref 26.0–34.0)
MCHC: 33.8 g/dL (ref 30.0–36.0)
MCV: 90.4 fL (ref 80.0–100.0)
Monocytes Absolute: 0.3 10*3/uL (ref 0.1–1.0)
Monocytes Relative: 6 %
Neutro Abs: 2.8 10*3/uL (ref 1.7–7.7)
Neutrophils Relative %: 54 %
Platelet Count: 193 10*3/uL (ref 150–400)
RBC: 4.78 MIL/uL (ref 4.22–5.81)
RDW: 12.1 % (ref 11.5–15.5)
WBC Count: 5.2 10*3/uL (ref 4.0–10.5)
nRBC: 0 % (ref 0.0–0.2)

## 2021-04-30 MED ORDER — LEUPROLIDE ACETATE (6 MONTH) 45 MG ~~LOC~~ KIT
45.0000 mg | PACK | Freq: Once | SUBCUTANEOUS | Status: AC
  Administered 2021-04-30: 45 mg via SUBCUTANEOUS
  Filled 2021-04-30: qty 45

## 2021-04-30 NOTE — Patient Instructions (Signed)

## 2021-04-30 NOTE — Progress Notes (Signed)
Hematology and Oncology Follow Up   Frank [name] [id number] [birth year] 50 y.o. 04/30/2021 9:01 AM [name], MDMorrow, Marjory Lies, MD       Principle Diagnosis: 50 year old man with castration-sensitive advanced prostate cancer with disease to the bone diagnosed in 2019.  He was found to have Gleason score 4+5 = 10 and a PSA of 27.8 at the time of diagnosis.   Prior Therapy:   He is status post prostate biopsy March 2019 which confirmed the diagnosis of Gleason score 5+5 = 10 prostate cancer.   Current therapy:   Eligard 45 mg every 6 months.  He will receive Eligard today and repeated in 6 months.  Zytiga 1000 mg prednisone 5 mg daily in April 2019.   Interim History: [name] returns today for repeat evaluation.  Since the last visit, he reports no major changes in his health.  He does report some fatigue and tiredness but still able to work and play golf occasionally.  He denies any bone pain or pathological fractures.  He denies any hospitalization or illnesses.  His performance status or quality of life remained excellent.   Medications: Updated on review. Current Outpatient Medications  Medication Sig Dispense Refill   abiraterone acetate (ZYTIGA) 250 MG tablet TAKE 4 TABLETS BY MOUTH ONCE DAILY AS DIRECTED.  TAKE 1 HOUR BEFORE OR 2 HOURS AFTER A MEAL 120 tablet 10   calcium carbonate (OS-CAL) 600 MG TABS tablet TAKE 1 TABLET BY MOUTH TWICE A DAY WITH A MEAL 150 tablet 3   [facility] CALCIUM 600 MG tablet Take 1 tablet by mouth 2 (two) times daily.     lansoprazole (PREVACID) 15 MG capsule Take 15 mg by mouth daily at 12 noon.     predniSONE (DELTASONE) 5 MG tablet TAKE 1 TABLET BY MOUTH EVERY DAY WITH BREAKFAST 30 tablet 90   valACYclovir (VALTREX) 500 MG tablet Take 500 mg by mouth 2 (two) times daily.     No current facility-administered medications for this visit.     Allergies: No Known Allergies    Physical exam:  Blood pressure (!) 136/92, pulse 90,  temperature (!) 97.2 F (36.2 C), temperature source Tympanic, resp. rate 18, height 5\' 9"  (1.753 m), weight 189 lb 8 oz (86 kg), SpO2 97 %.    ECOG 0   General appearance: Alert, awake without any distress. Head: Atraumatic without abnormalities Oropharynx: Without any thrush or ulcers. Eyes: No scleral icterus. Lymph nodes: No lymphadenopathy noted in the cervical, supraclavicular, or axillary nodes Heart:regular rate and rhythm, without any murmurs or gallops.   Lung: Clear to auscultation without any rhonchi, wheezes or dullness to percussion. Abdomin: Soft, nontender without any shifting dullness or ascites. Musculoskeletal: No clubbing or cyanosis. Neurological: No motor or sensory deficits. Skin: No rashes or lesions.         Lab Results: Lab Results  Component Value Date   WBC 5.8 11/26/2020   HGB 14.3 11/26/2020   HCT 42.4 11/26/2020   MCV 90.4 11/26/2020   PLT 192 11/26/2020     Chemistry      Component Value Date/Time   NA 144 11/26/2020 1103   K 3.9 11/26/2020 1103   CL 108 11/26/2020 1103   CO2 26 11/26/2020 1103   BUN 13 11/26/2020 1103   CREATININE 0.81 11/26/2020 1103      Component Value Date/Time   CALCIUM 9.8 11/26/2020 1103   ALKPHOS 119 11/26/2020 1103   AST 15 11/26/2020 1103   ALT 19 11/26/2020  1103   BILITOT 0.5 11/26/2020 1103       Results for ARHAN, MCMANAMON (MRN [id number]) as of 04/30/2021 09:03  Ref. Range 07/25/2020 09:21 11/26/2020 11:03  Prostate Specific Ag, Serum Latest Ref Range: 0.0 - 4.0 ng/mL <0.1 <0.1      Impression and Plan:   50 year old man with the:   1.   Castration-sensitive advanced prostate cancer with disease to the bone diagnosed in 2019.    His disease status was updated at this time and treatment choices were reviewed.  Laboratory data over the last 3 years were discussed and his PSA continues to be undetectable.  Imaging studies in January 2022 showed a near complete response to therapy at this  time without any residual disease.  At this time I recommended continuing treatment indefinitely given his excellent response and overall tolerance to treatment.  Alternative treatment options including systemic chemotherapy among others will be deferred if he developed castration-resistant disease.  He is agreeable to proceed at this time.  2.  Androgen depravation: He will receive Eligard today and repeated in 6 months.  Complications including weight gain, sexual dysfunction and osteoporosis were reviewed.   3. Bone directed therapy: He is on calcium and vitamin D supplements and Delton See has been deferred at this time.    4.  Goals of care: His disease is incurable although aggressive measures are warranted at this time.   5.    Covid vaccine considerations: I advised him to proceed with booster in the near future.  6.  Hypokalemia: His potassium has been adequate at this time and will continue to monitor on Zytiga.   7.  Follow-up: In 6 months for repeat evaluation.     30  minutes were dedicated to this encounter.  Time was spent on reviewing laboratory data, disease status update and outlining future plan of care.  [name], MD 04/30/2021 9:01 AM

## 2021-05-01 ENCOUNTER — Telehealth: Payer: Self-pay

## 2021-05-01 LAB — PROSTATE-SPECIFIC AG, SERUM (LABCORP): Prostate Specific Ag, Serum: <0.1 ng/mL (ref 0.0–4.0)

## 2021-05-01 NOTE — Telephone Encounter (Signed)
I spoke with pt and advised as indicated. Pt expressed understanding of this information. 

## 2021-05-01 NOTE — Telephone Encounter (Signed)
-----   Message from Wyatt Portela, MD sent at 05/01/2021 10:25 AM EDT ----- Please let him know his PSA is still low

## 2021-05-08 ENCOUNTER — Other Ambulatory Visit: Payer: Self-pay | Admitting: Oncology

## 2021-06-24 ENCOUNTER — Other Ambulatory Visit: Payer: Self-pay | Admitting: *Deleted

## 2021-06-24 NOTE — Telephone Encounter (Signed)
Received faxed refill request for Zytiga from [place] dated 06/22/21 Previous RX refill provided on  05/29/21 with refills. New Rx not needed at this time

## 2021-07-14 ENCOUNTER — Other Ambulatory Visit: Payer: Self-pay | Admitting: *Deleted

## 2021-07-14 DIAGNOSIS — C61 Malignant neoplasm of prostate: Secondary | ICD-10-CM

## 2021-07-14 MED ORDER — ABIRATERONE ACETATE 250 MG PO TABS: ORAL_TABLET | ORAL | 10 refills | Status: DC

## 2021-08-05 DIAGNOSIS — U071 COVID-19: Secondary | ICD-10-CM | POA: Diagnosis not present

## 2021-08-17 ENCOUNTER — Telehealth: Payer: Self-pay | Admitting: *Deleted

## 2021-08-17 NOTE — Telephone Encounter (Signed)
Patient called about Wynetta Emery and [place] patient assistance discontinuation.    Routing to Runell Gess to make you aware so that you can look into grant information for drug assistance for Zytiga.

## 2021-10-08 ENCOUNTER — Encounter: Payer: Self-pay | Admitting: Oncology

## 2021-10-08 ENCOUNTER — Telehealth: Payer: Self-pay | Admitting: Pharmacy Technician

## 2021-10-08 ENCOUNTER — Other Ambulatory Visit (HOSPITAL_COMMUNITY): Payer: Self-pay

## 2021-10-08 NOTE — Telephone Encounter (Signed)
Oral Oncology Patient Advocate Encounter   Received notification from [facility] that prior authorization for Zytiga is required.   PA submitted on CoverMyMeds Key [id number] Status is pending   Oral Oncology Clinic will continue to follow.  [place] Patient [place] Phone [phone] Fax [phone] 10/08/2021 11:23 AM

## 2021-10-12 ENCOUNTER — Other Ambulatory Visit (HOSPITAL_COMMUNITY): Payer: Self-pay

## 2021-10-12 NOTE — Telephone Encounter (Signed)
Oral Oncology Patient Advocate Encounter  Prior Authorization for Abiraterone Fabio Asa) has been approved.    PA# [id number] Effective dates: 10/08/21 through 10/07/22  Patients co-pay is $281.52 for 15 day supply (per insurance, quantity limitation of 15 day supply for first 1-3 months of therapy).  Oral Oncology Clinic will continue to follow.   [place] Patient [place] Phone [phone] Fax [phone] 10/12/2021 9:21 AM

## 2021-10-28 ENCOUNTER — Other Ambulatory Visit (HOSPITAL_COMMUNITY): Payer: Self-pay

## 2021-10-29 ENCOUNTER — Encounter: Payer: Self-pay | Admitting: Oncology

## 2021-10-29 ENCOUNTER — Inpatient Hospital Stay: Payer: BC Managed Care – PPO

## 2021-10-29 ENCOUNTER — Inpatient Hospital Stay: Payer: BC Managed Care – PPO | Attending: Oncology

## 2021-10-29 ENCOUNTER — Inpatient Hospital Stay: Payer: BC Managed Care – PPO | Admitting: Oncology

## 2021-10-29 ENCOUNTER — Other Ambulatory Visit: Payer: Self-pay

## 2021-10-29 VITALS — BP 127/83 | HR 92 | Temp 98.1°F | Resp 17 | Ht 69.0 in | Wt 188.5 lb

## 2021-10-29 DIAGNOSIS — C61 Malignant neoplasm of prostate: Secondary | ICD-10-CM

## 2021-10-29 DIAGNOSIS — Z5111 Encounter for antineoplastic chemotherapy: Secondary | ICD-10-CM | POA: Diagnosis not present

## 2021-10-29 DIAGNOSIS — E876 Hypokalemia: Secondary | ICD-10-CM | POA: Insufficient documentation

## 2021-10-29 LAB — CMP (CANCER CENTER ONLY)
ALT: 22 U/L (ref 0–44)
AST: 16 U/L (ref 15–41)
Albumin: 4.3 g/dL (ref 3.5–5.0)
Alkaline Phosphatase: 131 U/L — ABNORMAL HIGH (ref 38–126)
Anion gap: 12 (ref 5–15)
BUN: 12 mg/dL (ref 6–20)
CO2: 22 mmol/L (ref 22–32)
Calcium: 9.3 mg/dL (ref 8.9–10.3)
Chloride: 110 mmol/L (ref 98–111)
Creatinine: 0.83 mg/dL (ref 0.61–1.24)
GFR, Estimated: >60 mL/min (ref >60)
Glucose, Bld: 110 mg/dL — ABNORMAL HIGH (ref 70–99)
Potassium: 3.5 mmol/L (ref 3.5–5.1)
Sodium: 144 mmol/L (ref 135–145)
Total Bilirubin: 0.6 mg/dL (ref 0.3–1.2)
Total Protein: 7.2 g/dL (ref 6.5–8.1)

## 2021-10-29 LAB — CBC WITH DIFFERENTIAL (CANCER CENTER ONLY)
Abs Immature Granulocytes: 0.02 10*3/uL (ref 0.00–0.07)
Basophils Absolute: 0 10*3/uL (ref 0.0–0.1)
Basophils Relative: 1 %
Eosinophils Absolute: 0.4 10*3/uL (ref 0.0–0.5)
Eosinophils Relative: 7 %
HCT: 42.7 % (ref 39.0–52.0)
Hemoglobin: 14.5 g/dL (ref 13.0–17.0)
Immature Granulocytes: 0 %
Lymphocytes Relative: 31 %
Lymphs Abs: 1.5 10*3/uL (ref 0.7–4.0)
MCH: 30.3 pg (ref 26.0–34.0)
MCHC: 34 g/dL (ref 30.0–36.0)
MCV: 89.3 fL (ref 80.0–100.0)
Monocytes Absolute: 0.3 10*3/uL (ref 0.1–1.0)
Monocytes Relative: 7 %
Neutro Abs: 2.6 10*3/uL (ref 1.7–7.7)
Neutrophils Relative %: 54 %
Platelet Count: 184 10*3/uL (ref 150–400)
RBC: 4.78 MIL/uL (ref 4.22–5.81)
RDW: 12 % (ref 11.5–15.5)
WBC Count: 4.9 10*3/uL (ref 4.0–10.5)
nRBC: 0 % (ref 0.0–0.2)

## 2021-10-29 MED ORDER — LEUPROLIDE ACETATE (6 MONTH) 45 MG ~~LOC~~ KIT
45.0000 mg | PACK | Freq: Once | SUBCUTANEOUS | Status: AC
  Administered 2021-10-29: 45 mg via SUBCUTANEOUS
  Filled 2021-10-29: qty 45

## 2021-10-29 NOTE — Progress Notes (Signed)
Hematology and Oncology Follow Up   BONNIE ROIG [id number] 1971-01-18 50 y.o. 10/29/2021 8:12 AM [name], MDMorrow, Marjory Lies, MD       Principle Diagnosis: 50 year old man with advanced prostate cancer with disease to the bone diagnosed in 2019.  He has castration-sensitive after presenting at the time of diagnosis with Gleason score 4+5 = 10 and a PSA of 27.8.   Prior Therapy:   He is status post prostate biopsy March 2019 which confirmed the diagnosis of Gleason score 5+5 = 10 prostate cancer.   Current therapy:   Eligard 45 mg every 6 months.  He is due for next Eligard injection on October 29, 2021.  Zytiga 1000 mg prednisone 5 mg daily in April 2019.   Interim History: Mr. Stefan is here for a follow-up visit.  Since the last visit, he reports no major changes in his health.  He continues to tolerate Zytiga without any major complaints.  He denies any nausea, fatigue or bone pain.  He denies any hospitalization or illnesses.  He does report occasional hot flashes and decrease in his exercise tolerance but overall no major complaints.  He used to golf regularly and works full-time.   Medications: Reviewed without changes. Current Outpatient Medications  Medication Sig Dispense Refill   abiraterone acetate (ZYTIGA) 250 MG tablet TAKE 4 TABLETS BY MOUTH ONCE DAILY AS DIRECTED.  TAKE 1 HOUR BEFORE OR 2 HOURS AFTER A MEAL 120 tablet 10   calcium carbonate (OS-CAL) 600 MG TABS tablet TAKE 1 TABLET BY MOUTH TWICE A DAY WITH A MEAL 150 tablet 3   CVS CALCIUM 600 MG tablet Take 1 tablet by mouth 2 (two) times daily.     lansoprazole (PREVACID) 15 MG capsule Take 15 mg by mouth daily at 12 noon.     predniSONE (DELTASONE) 5 MG tablet TAKE 1 TABLET BY MOUTH EVERY DAY WITH BREAKFAST 30 tablet 90   valACYclovir (VALTREX) 500 MG tablet Take 500 mg by mouth 2 (two) times daily.     No current facility-administered medications for this visit.     Allergies: No Known  Allergies    Physical exam:  Blood pressure 127/83, pulse 92, temperature 98.1 F (36.7 C), temperature source Temporal, resp. rate 17, height 5\' 9"  (1.753 m), weight 188 lb 8 oz (85.5 kg), SpO2 99 %.     ECOG 0    General appearance: Comfortable appearing without any discomfort Head: Normocephalic without any trauma Oropharynx: Mucous membranes are moist and pink without any thrush or ulcers. Eyes: Pupils are equal and round reactive to light. Lymph nodes: No cervical, supraclavicular, inguinal or axillary lymphadenopathy.   Heart:regular rate and rhythm.  S1 and S2 without leg edema. Lung: Clear without any rhonchi or wheezes.  No dullness to percussion. Abdomin: Soft, nontender, nondistended with good bowel sounds.  No hepatosplenomegaly. Musculoskeletal: No joint deformity or effusion.  Full range of motion noted. Neurological: No deficits noted on motor, sensory and deep tendon reflex exam. Skin: No petechial rash or dryness.  Appeared moist.           Lab Results: Lab Results  Component Value Date   WBC 5.2 04/30/2021   HGB 14.6 04/30/2021   HCT 43.2 04/30/2021   MCV 90.4 04/30/2021   PLT 193 04/30/2021     Chemistry      Component Value Date/Time   NA 143 04/30/2021 0839   K 3.4 (L) 04/30/2021 0839   CL 109 04/30/2021 0839   CO2 23  04/30/2021 0839   BUN 13 04/30/2021 0839   CREATININE 0.82 04/30/2021 0839      Component Value Date/Time   CALCIUM 9.6 04/30/2021 0839   ALKPHOS 120 04/30/2021 0839   AST 14 (L) 04/30/2021 0839   ALT 17 04/30/2021 0839   BILITOT 0.6 04/30/2021 0839          Impression and Plan:   50 year old man with the:   1.   Advanced prostate cancer with disease to the bone diagnosed in 2019.  He has castration-sensitive disease.  He continues to tolerate current therapy without any major complaints and PSA remains undetectable.  Risks and benefits of continuing Zytiga were discussed at this time.  Complications that  include hypertension, weight gain and hypokalemia were reviewed.  He is agreeable to proceed at this time and continue with this treatment.  Alternative treatment options including different oral targeted therapy, systemic chemotherapy among others.  We will update his staging scans before the next visit.  2.  Androgen depravation: Long-term complication occluding weight gain and hot flashes and sexual dysfunction were reviewed.  He will receive Eligard today and repeated in 6 months.   3. Bone directed therapy: He is to continue calcium and vitamin D supplements.    4.  Goals of care: Therapy remains palliative although aggressive measures are warranted given his excellent performance status.   5.  Hypokalemia: Related to Zytiga.  We discussed strategies to improve his potassium including increasing potassium rich diet.  6.  Weight gain and loss of muscle mass: We have discussed strategies to improve that including cardiovascular and weight resistant exercises which will help in this process.  7.  Follow-up: In 6 months for repeat evaluation.     30  minutes were spent on this visit.  The time was dedicated to reviewing his disease status, treatment choices and future plan of care discussion.  [name], MD 10/29/2021 8:12 AM

## 2021-10-30 ENCOUNTER — Telehealth: Payer: Self-pay | Admitting: *Deleted

## 2021-10-30 LAB — PROSTATE-SPECIFIC AG, SERUM (LABCORP): Prostate Specific Ag, Serum: <0.1 ng/mL (ref 0.0–4.0)

## 2021-10-30 NOTE — Telephone Encounter (Signed)
PC to patient, informed him his PSA is <0.1    He verbalizes understanding.

## 2021-10-30 NOTE — Telephone Encounter (Signed)
-----   Message from Wyatt Portela, MD sent at 10/30/2021  8:22 AM EST ----- Please let him know his PSA is still low

## 2021-11-12 ENCOUNTER — Other Ambulatory Visit (HOSPITAL_COMMUNITY): Payer: Self-pay

## 2021-11-12 ENCOUNTER — Telehealth: Payer: Self-pay | Admitting: *Deleted

## 2021-11-12 ENCOUNTER — Other Ambulatory Visit: Payer: Self-pay | Admitting: *Deleted

## 2021-11-12 ENCOUNTER — Encounter: Payer: Self-pay | Admitting: Oncology

## 2021-11-12 DIAGNOSIS — C61 Malignant neoplasm of prostate: Secondary | ICD-10-CM

## 2021-11-12 MED ORDER — ABIRATERONE ACETATE 250 MG PO TABS
ORAL_TABLET | ORAL | 10 refills | Status: DC
  Filled 2021-11-12: qty 120, fill #0
  Filled 2022-01-20 (×3): qty 60, 15d supply, fill #0
  Filled 2022-02-04: qty 60, 15d supply, fill #1
  Filled 2022-02-15: qty 60, 15d supply, fill #2
  Filled 2022-03-11: qty 60, 15d supply, fill #3
  Filled 2022-03-18: qty 60, 15d supply, fill #4
  Filled 2022-04-06: qty 60, 15d supply, fill #5
  Filled 2022-04-22: qty 120, 30d supply, fill #6
  Filled 2022-05-13 – 2022-05-21 (×2): qty 120, 30d supply, fill #7
  Filled 2022-06-15: qty 120, 30d supply, fill #8
  Filled 2022-07-14: qty 120, 30d supply, fill #9

## 2021-11-12 NOTE — Telephone Encounter (Signed)
Brinson wants to know if he can take Magnesium supplement gummies 100 mg for sleep/stress.

## 2021-11-12 NOTE — Telephone Encounter (Signed)
Notified that it is OK with Dr Alen Blew to take Magnesium supplement

## 2021-12-09 ENCOUNTER — Other Ambulatory Visit (HOSPITAL_COMMUNITY): Payer: Self-pay

## 2021-12-15 ENCOUNTER — Telehealth: Payer: Self-pay | Admitting: Oncology

## 2021-12-15 NOTE — Telephone Encounter (Signed)
Called patient regarding 06/09 rescheduled appointment time, patient has been called and notified.

## 2022-01-15 ENCOUNTER — Other Ambulatory Visit (HOSPITAL_COMMUNITY): Payer: Self-pay

## 2022-01-20 ENCOUNTER — Other Ambulatory Visit (HOSPITAL_COMMUNITY): Payer: Self-pay

## 2022-01-21 ENCOUNTER — Other Ambulatory Visit (HOSPITAL_COMMUNITY): Payer: Self-pay

## 2022-02-04 ENCOUNTER — Other Ambulatory Visit (HOSPITAL_COMMUNITY): Payer: Self-pay

## 2022-02-09 ENCOUNTER — Other Ambulatory Visit (HOSPITAL_COMMUNITY): Payer: Self-pay

## 2022-02-11 ENCOUNTER — Other Ambulatory Visit (HOSPITAL_COMMUNITY): Payer: Self-pay

## 2022-02-15 ENCOUNTER — Other Ambulatory Visit (HOSPITAL_COMMUNITY): Payer: Self-pay

## 2022-02-16 ENCOUNTER — Other Ambulatory Visit (HOSPITAL_COMMUNITY): Payer: Self-pay

## 2022-02-26 ENCOUNTER — Other Ambulatory Visit (HOSPITAL_COMMUNITY): Payer: Self-pay

## 2022-03-01 ENCOUNTER — Other Ambulatory Visit (HOSPITAL_COMMUNITY): Payer: Self-pay

## 2022-03-11 ENCOUNTER — Other Ambulatory Visit (HOSPITAL_COMMUNITY): Payer: Self-pay

## 2022-03-17 ENCOUNTER — Other Ambulatory Visit (HOSPITAL_COMMUNITY): Payer: Self-pay

## 2022-03-18 ENCOUNTER — Other Ambulatory Visit (HOSPITAL_COMMUNITY): Payer: Self-pay

## 2022-03-24 ENCOUNTER — Other Ambulatory Visit (HOSPITAL_COMMUNITY): Payer: Self-pay

## 2022-03-29 ENCOUNTER — Other Ambulatory Visit (HOSPITAL_COMMUNITY): Payer: Self-pay

## 2022-04-06 ENCOUNTER — Other Ambulatory Visit (HOSPITAL_COMMUNITY): Payer: Self-pay

## 2022-04-12 ENCOUNTER — Other Ambulatory Visit (HOSPITAL_COMMUNITY): Payer: Self-pay

## 2022-04-15 ENCOUNTER — Other Ambulatory Visit (HOSPITAL_COMMUNITY): Payer: Self-pay

## 2022-04-22 ENCOUNTER — Other Ambulatory Visit (HOSPITAL_COMMUNITY): Payer: Self-pay

## 2022-04-22 ENCOUNTER — Inpatient Hospital Stay: Payer: BC Managed Care – PPO | Attending: Oncology

## 2022-04-22 ENCOUNTER — Other Ambulatory Visit: Payer: Self-pay

## 2022-04-22 DIAGNOSIS — Z5111 Encounter for antineoplastic chemotherapy: Secondary | ICD-10-CM | POA: Insufficient documentation

## 2022-04-22 DIAGNOSIS — E876 Hypokalemia: Secondary | ICD-10-CM | POA: Insufficient documentation

## 2022-04-22 DIAGNOSIS — C61 Malignant neoplasm of prostate: Secondary | ICD-10-CM | POA: Insufficient documentation

## 2022-04-22 DIAGNOSIS — C7951 Secondary malignant neoplasm of bone: Secondary | ICD-10-CM | POA: Insufficient documentation

## 2022-04-22 LAB — CMP (CANCER CENTER ONLY)
ALT: 20 U/L (ref 0–44)
AST: 19 U/L (ref 15–41)
Albumin: 4.6 g/dL (ref 3.5–5.0)
Alkaline Phosphatase: 102 U/L (ref 38–126)
Anion gap: 7 (ref 5–15)
BUN: 13 mg/dL (ref 6–20)
CO2: 27 mmol/L (ref 22–32)
Calcium: 10 mg/dL (ref 8.9–10.3)
Chloride: 108 mmol/L (ref 98–111)
Creatinine: 0.81 mg/dL (ref 0.61–1.24)
GFR, Estimated: >60 mL/min (ref >60)
Glucose, Bld: 124 mg/dL — ABNORMAL HIGH (ref 70–99)
Potassium: 3.9 mmol/L (ref 3.5–5.1)
Sodium: 142 mmol/L (ref 135–145)
Total Bilirubin: 0.7 mg/dL (ref 0.3–1.2)
Total Protein: 7.2 g/dL (ref 6.5–8.1)

## 2022-04-22 LAB — CBC WITH DIFFERENTIAL (CANCER CENTER ONLY)
Abs Immature Granulocytes: 0.02 10*3/uL (ref 0.00–0.07)
Basophils Absolute: 0 10*3/uL (ref 0.0–0.1)
Basophils Relative: 1 %
Eosinophils Absolute: 0.2 10*3/uL (ref 0.0–0.5)
Eosinophils Relative: 4 %
HCT: 41.5 % (ref 39.0–52.0)
Hemoglobin: 14.5 g/dL (ref 13.0–17.0)
Immature Granulocytes: 0 %
Lymphocytes Relative: 34 %
Lymphs Abs: 1.7 10*3/uL (ref 0.7–4.0)
MCH: 31.6 pg (ref 26.0–34.0)
MCHC: 34.9 g/dL (ref 30.0–36.0)
MCV: 90.4 fL (ref 80.0–100.0)
Monocytes Absolute: 0.4 10*3/uL (ref 0.1–1.0)
Monocytes Relative: 8 %
Neutro Abs: 2.7 10*3/uL (ref 1.7–7.7)
Neutrophils Relative %: 53 %
Platelet Count: 186 10*3/uL (ref 150–400)
RBC: 4.59 MIL/uL (ref 4.22–5.81)
RDW: 11.9 % (ref 11.5–15.5)
WBC Count: 5 10*3/uL (ref 4.0–10.5)
nRBC: 0 % (ref 0.0–0.2)

## 2022-04-23 ENCOUNTER — Other Ambulatory Visit (HOSPITAL_COMMUNITY): Payer: Self-pay

## 2022-04-23 LAB — PROSTATE-SPECIFIC AG, SERUM (LABCORP): Prostate Specific Ag, Serum: <0.1 ng/mL (ref 0.0–4.0)

## 2022-04-26 ENCOUNTER — Other Ambulatory Visit (HOSPITAL_COMMUNITY): Payer: Self-pay

## 2022-04-29 ENCOUNTER — Other Ambulatory Visit: Payer: BC Managed Care – PPO

## 2022-04-29 ENCOUNTER — Inpatient Hospital Stay: Payer: BC Managed Care – PPO | Admitting: Oncology

## 2022-04-29 ENCOUNTER — Inpatient Hospital Stay: Payer: BC Managed Care – PPO

## 2022-04-29 ENCOUNTER — Other Ambulatory Visit: Payer: Self-pay

## 2022-04-29 VITALS — BP 145/85 | HR 83 | Temp 97.2°F | Resp 18 | Ht 69.0 in | Wt 187.4 lb

## 2022-04-29 DIAGNOSIS — C61 Malignant neoplasm of prostate: Secondary | ICD-10-CM

## 2022-04-29 DIAGNOSIS — C7951 Secondary malignant neoplasm of bone: Secondary | ICD-10-CM | POA: Diagnosis not present

## 2022-04-29 DIAGNOSIS — E876 Hypokalemia: Secondary | ICD-10-CM | POA: Diagnosis not present

## 2022-04-29 DIAGNOSIS — Z5111 Encounter for antineoplastic chemotherapy: Secondary | ICD-10-CM | POA: Diagnosis not present

## 2022-04-29 MED ORDER — LEUPROLIDE ACETATE (6 MONTH) 45 MG ~~LOC~~ KIT
45.0000 mg | PACK | Freq: Once | SUBCUTANEOUS | Status: AC
  Administered 2022-04-29: 45 mg via SUBCUTANEOUS
  Filled 2022-04-29: qty 45

## 2022-04-29 NOTE — Progress Notes (Signed)
Hematology and Oncology Follow Up   Frank [name] [id number] 28-Nov-1970 51 y.o. 04/29/2022 8:20 AM [name], MDMorrow, Marjory Lies, MD       Principle Diagnosis: 51 year old man with castration-sensitive advanced prostate cancer with disease to the bone diagnosed in 2019.  He presented with Gleason score 4+5 = 10 and a PSA of 27.8.   Prior Therapy:   He is status post prostate biopsy March 2019 which confirmed the diagnosis of Gleason score 5+5 = 10 prostate cancer.   Current therapy:   Eligard 45 mg every 6 months.  He will receive Eligard today and repeated in 6 months.  Zytiga 1000 mg prednisone 5 mg daily in April 2019.   Interim History: Frank [name] returns today for repeat evaluation.  Since last visit, he continues to tolerate Zytiga with few complaints.  He reports fatigue and tiredness although he is still able to perform most activities of daily living and has affected.  He denies any bone pain or pathological fractures.  He denies any falls or syncope.  His performance status quality of life remains unchanged.   Medications: Updated on review.. Current Outpatient Medications  Medication Sig Dispense Refill   abiraterone acetate (ZYTIGA) 250 MG tablet TAKE 4 TABLETS BY MOUTH ONCE DAILY AS DIRECTED.  TAKE 1 HOUR BEFORE OR 2 HOURS AFTER A MEAL 120 tablet 10   calcium carbonate (OS-CAL) 600 MG TABS tablet TAKE 1 TABLET BY MOUTH TWICE A DAY WITH A MEAL 150 tablet 3   [facility]S CALCIUM 600 MG tablet Take 1 tablet by mouth 2 (two) times daily.     lansoprazole (PREVACID) 15 MG capsule Take 15 mg by mouth daily at 12 noon.     predniSONE (DELTASONE) 5 MG tablet TAKE 1 TABLET BY MOUTH EVERY DAY WITH BREAKFAST 30 tablet 90   valACYclovir (VALTREX) 500 MG tablet Take 500 mg by mouth 2 (two) times daily.     No current facility-administered medications for this visit.     Allergies: No Known Allergies    Physical exam:  Blood pressure (!) 145/85, pulse 83, temperature (!)  97.2 F (36.2 C), temperature source Tympanic, resp. rate 18, height '5\' 9"'$  (1.753 m), weight 187 lb 6.4 oz (85 kg), SpO2 100 %.     ECOG 0   General appearance: Alert, awake without any distress. Head: Atraumatic without abnormalities Oropharynx: Without any thrush or ulcers. Eyes: No scleral icterus. Lymph nodes: No lymphadenopathy noted in the cervical, supraclavicular, or axillary nodes Heart:regular rate and rhythm, without any murmurs or gallops.   Lung: Clear to auscultation without any rhonchi, wheezes or dullness to percussion. Abdomin: Soft, nontender without any shifting dullness or ascites. Musculoskeletal: No clubbing or cyanosis. Neurological: No motor or sensory deficits. Skin: No rashes or lesions.          Lab Results: Lab Results  Component Value Date   WBC 5.0 04/22/2022   HGB 14.5 04/22/2022   HCT 41.5 04/22/2022   MCV 90.4 04/22/2022   PLT 186 04/22/2022     Chemistry      Component Value Date/Time   NA 142 04/22/2022 0842   K 3.9 04/22/2022 0842   CL 108 04/22/2022 0842   CO2 27 04/22/2022 0842   BUN 13 04/22/2022 0842   CREATININE 0.81 04/22/2022 0842      Component Value Date/Time   CALCIUM 10.0 04/22/2022 0842   ALKPHOS 102 04/22/2022 0842   AST 19 04/22/2022 0842   ALT 20 04/22/2022 0842  BILITOT 0.7 04/22/2022 0842        Latest Reference Range & Units 04/30/21 08:39 10/29/21 07:55 04/22/22 08:42  Prostate Specific Ag, Serum 0.0 - 4.0 ng/mL <0.1 <0.1 <0.1     Impression and Plan:   51 year old man with the:   1.   Castration-sensitive advanced prostate cancer with disease to the bone diagnosed in 2019.    The natural course of his disease was reviewed at this time and risks and benefits of continuing Zytiga were discussed.  His PSA continues to be undetectable and we will update his staging scan in the near future.  We have also discussed the possibility of therapy de-escalation and withholding Zytiga if he continues to  have no evidence of disease in the next 6 months and no cancer detected on his imaging studies.  For the time being he is willing to continue Zytiga but would like a therapy break given the side effects he is experiencing.  We will reevaluate this in the next 6 months.  2.  Androgen depravation: He will receive Eligard today and repeated in 6 months.  Complication including hot flashes, fatigue and sexual dysfunction were reviewed.   3. Bone directed therapy: I recommended calcium and vitamin D supplements.  Delton See option has been deferred.    4.  Goals of care: His disease is incurable although aggressive measures are warranted given his young age and excellent performance status.   5.  Hypokalemia: His potassium remains normal without any need for supplementation.  6.  Weight gain and loss of muscle mass: I continue to recommend resistance training and cardiovascular exercise.  7.  Follow-up: In 6 months for repeat evaluation.  This will be sooner if his imaging studies showed abnormalities.     30  minutes were dedicated to this encounter.  The time was spent on reviewing laboratory data, imaging studies and discussing treatment choices and future plan of care review.  [name], MD 04/29/2022 8:20 AM

## 2022-04-30 ENCOUNTER — Ambulatory Visit: Payer: BC Managed Care – PPO | Admitting: Oncology

## 2022-04-30 ENCOUNTER — Ambulatory Visit: Payer: BC Managed Care – PPO

## 2022-05-06 ENCOUNTER — Ambulatory Visit (HOSPITAL_COMMUNITY)
Admission: RE | Admit: 2022-05-06 | Discharge: 2022-05-06 | Disposition: A | Discharge status: Home / Self Care | Payer: BC Managed Care – PPO | Source: Ambulatory Visit | Attending: Oncology | Admitting: Oncology

## 2022-05-06 ENCOUNTER — Ambulatory Visit: Payer: BC Managed Care – PPO | Admitting: Oncology

## 2022-05-06 ENCOUNTER — Ambulatory Visit: Payer: BC Managed Care – PPO

## 2022-05-06 DIAGNOSIS — N200 Calculus of kidney: Secondary | ICD-10-CM | POA: Diagnosis not present

## 2022-05-06 DIAGNOSIS — I700 Atherosclerosis of aorta: Secondary | ICD-10-CM | POA: Diagnosis not present

## 2022-05-06 DIAGNOSIS — C61 Malignant neoplasm of prostate: Secondary | ICD-10-CM

## 2022-05-06 DIAGNOSIS — I2510 Atherosclerotic heart disease of native coronary artery without angina pectoris: Secondary | ICD-10-CM | POA: Diagnosis not present

## 2022-05-13 ENCOUNTER — Encounter (HOSPITAL_COMMUNITY)
Admission: RE | Admit: 2022-05-13 | Discharge: 2022-05-13 | Disposition: A | Discharge status: Home / Self Care | Payer: BC Managed Care – PPO | Source: Ambulatory Visit | Attending: Oncology | Admitting: Oncology

## 2022-05-13 ENCOUNTER — Other Ambulatory Visit (HOSPITAL_COMMUNITY): Payer: Self-pay

## 2022-05-13 DIAGNOSIS — C61 Malignant neoplasm of prostate: Secondary | ICD-10-CM | POA: Insufficient documentation

## 2022-05-13 DIAGNOSIS — Z8546 Personal history of malignant neoplasm of prostate: Secondary | ICD-10-CM | POA: Diagnosis not present

## 2022-05-13 MED ORDER — TECHNETIUM TC 99M MEDRONATE IV KIT
20.0000 | PACK | Freq: Once | INTRAVENOUS | Status: AC | PRN
  Administered 2022-05-13: 20.3 via INTRAVENOUS

## 2022-05-21 ENCOUNTER — Other Ambulatory Visit (HOSPITAL_COMMUNITY): Payer: Self-pay

## 2022-05-24 ENCOUNTER — Other Ambulatory Visit (HOSPITAL_COMMUNITY): Payer: Self-pay

## 2022-05-26 ENCOUNTER — Other Ambulatory Visit: Payer: Self-pay | Admitting: Oncology

## 2022-06-15 ENCOUNTER — Other Ambulatory Visit (HOSPITAL_COMMUNITY): Payer: Self-pay

## 2022-06-21 ENCOUNTER — Other Ambulatory Visit (HOSPITAL_COMMUNITY): Payer: Self-pay

## 2022-06-24 ENCOUNTER — Telehealth: Payer: Self-pay | Admitting: *Deleted

## 2022-06-24 ENCOUNTER — Other Ambulatory Visit (HOSPITAL_COMMUNITY): Payer: Self-pay

## 2022-06-24 NOTE — Telephone Encounter (Signed)
Dwaine states he is now taking generic zytiga and is now having migraines. " I never had migraines before I started the generic."

## 2022-06-24 NOTE — Telephone Encounter (Signed)
Dr Alen Blew wants him to take 2 tablets daily and call us in a week or 2 to let us know how he is tolerating the lower dose.

## 2022-07-01 ENCOUNTER — Telehealth: Payer: Self-pay | Admitting: Oncology

## 2022-07-01 NOTE — Telephone Encounter (Signed)
Called patient regarding upcoming December appointment, patient is notified. 

## 2022-07-14 ENCOUNTER — Other Ambulatory Visit (HOSPITAL_COMMUNITY): Payer: Self-pay

## 2022-07-19 ENCOUNTER — Other Ambulatory Visit (HOSPITAL_COMMUNITY): Payer: Self-pay

## 2022-07-20 ENCOUNTER — Other Ambulatory Visit (HOSPITAL_COMMUNITY): Payer: Self-pay

## 2022-07-21 ENCOUNTER — Other Ambulatory Visit (HOSPITAL_COMMUNITY): Payer: Self-pay

## 2022-08-09 ENCOUNTER — Other Ambulatory Visit (HOSPITAL_COMMUNITY): Payer: Self-pay

## 2022-08-30 ENCOUNTER — Other Ambulatory Visit: Payer: Self-pay | Admitting: Oncology

## 2022-08-30 ENCOUNTER — Other Ambulatory Visit (HOSPITAL_COMMUNITY): Payer: Self-pay

## 2022-08-30 ENCOUNTER — Other Ambulatory Visit: Payer: Self-pay | Admitting: *Deleted

## 2022-08-30 DIAGNOSIS — C61 Malignant neoplasm of prostate: Secondary | ICD-10-CM

## 2022-08-30 MED ORDER — ABIRATERONE ACETATE 250 MG PO TABS
500.0000 mg | ORAL_TABLET | Freq: Every day | ORAL | 2 refills | Status: DC
  Filled 2022-08-30: qty 60, 30d supply, fill #0
  Filled 2022-09-23: qty 60, 30d supply, fill #1
  Filled 2022-10-19 – 2022-10-26 (×3): qty 60, 30d supply, fill #2

## 2022-08-30 MED ORDER — ABIRATERONE ACETATE 250 MG PO TABS
ORAL_TABLET | ORAL | 10 refills | Status: DC
  Filled 2022-08-30: qty 120, fill #0

## 2022-08-30 NOTE — Telephone Encounter (Signed)
Received following message from Drema Halon, [facility] regarding prescription for Zytiga 250 mg, 2 tablets once daily:  [facility] was wondering if a new prescription for the zytiga could be sent in to have the two tablets by mouth daily change since he got dose reduced.  Outpatient notified me that he is still taking the 2 tablets.  Dr. Alen Blew informed  Contacted patient to confirm patient has been taking Zytiga 250 mg tablet, 2 tablets daily. Patient said he called office in August and spoke with nurse/Dr. Alen Blew to report migraines when taking 250 mg tablet, 4 tablets daily. He was instructed to decrease to 2 tablets daily due to migraines at that time.  Per Dr. Hazeline Junker request - new Rx for patient sent to [facility] for Zytiga 250 tablets, take TAKE 2 TABLETS BY MOUTH ONCE DAILY AS DIRECTED. TAKE 1 HOUR BEFORE OR 2 HOURS AFTER A MEAL; Quantity #60

## 2022-08-31 ENCOUNTER — Other Ambulatory Visit (HOSPITAL_COMMUNITY): Payer: Self-pay

## 2022-09-21 ENCOUNTER — Other Ambulatory Visit (HOSPITAL_COMMUNITY): Payer: Self-pay

## 2022-09-23 ENCOUNTER — Other Ambulatory Visit (HOSPITAL_COMMUNITY): Payer: Self-pay

## 2022-09-27 ENCOUNTER — Telehealth: Payer: Self-pay | Admitting: Pharmacy Technician

## 2022-09-27 ENCOUNTER — Other Ambulatory Visit (HOSPITAL_COMMUNITY): Payer: Self-pay

## 2022-09-27 NOTE — Telephone Encounter (Signed)
Oral Oncology Patient Advocate Encounter   Received notification that prior authorization for Abiraterone is due for renewal.   PA submitted on 09/27/22 Key [id number] Status is pending     Lady Deutscher, CPhT-Adv Oncology Pharmacy Patient [place] Direct Number: [phone]  Fax: [phone]

## 2022-09-27 NOTE — Telephone Encounter (Signed)
Oral Oncology Patient Advocate Encounter  Prior Authorization for Abiraterone has been approved.    PA# [id number] Effective dates: 09/27/22 through 09/26/23  Patients co-pay is $1,076.95.   Patient has copay card on file at [facility] to bring cost down to $5.  Lady Deutscher, CPhT-Adv Oncology Pharmacy Patient [place] Direct Number: [phone]  Fax: [phone]

## 2022-09-28 ENCOUNTER — Other Ambulatory Visit (HOSPITAL_COMMUNITY): Payer: Self-pay

## 2022-10-19 ENCOUNTER — Other Ambulatory Visit (HOSPITAL_COMMUNITY): Payer: Self-pay

## 2022-10-22 ENCOUNTER — Other Ambulatory Visit (HOSPITAL_COMMUNITY): Payer: Self-pay

## 2022-10-25 ENCOUNTER — Other Ambulatory Visit (HOSPITAL_COMMUNITY): Payer: Self-pay

## 2022-10-26 ENCOUNTER — Other Ambulatory Visit (HOSPITAL_COMMUNITY): Payer: Self-pay

## 2022-10-27 ENCOUNTER — Other Ambulatory Visit (HOSPITAL_COMMUNITY): Payer: Self-pay

## 2022-10-28 ENCOUNTER — Inpatient Hospital Stay: Payer: BC Managed Care – PPO | Attending: Oncology

## 2022-10-28 DIAGNOSIS — Z5111 Encounter for antineoplastic chemotherapy: Secondary | ICD-10-CM | POA: Diagnosis not present

## 2022-10-28 DIAGNOSIS — E876 Hypokalemia: Secondary | ICD-10-CM | POA: Diagnosis not present

## 2022-10-28 DIAGNOSIS — C61 Malignant neoplasm of prostate: Secondary | ICD-10-CM | POA: Diagnosis not present

## 2022-10-28 DIAGNOSIS — C7951 Secondary malignant neoplasm of bone: Secondary | ICD-10-CM | POA: Diagnosis not present

## 2022-10-28 LAB — CBC WITH DIFFERENTIAL (CANCER CENTER ONLY)
Abs Immature Granulocytes: 0.02 10*3/uL (ref 0.00–0.07)
Basophils Absolute: 0 10*3/uL (ref 0.0–0.1)
Basophils Relative: 1 %
Eosinophils Absolute: 0.1 10*3/uL (ref 0.0–0.5)
Eosinophils Relative: 1 %
HCT: 40.5 % (ref 39.0–52.0)
Hemoglobin: 14 g/dL (ref 13.0–17.0)
Immature Granulocytes: 0 %
Lymphocytes Relative: 16 %
Lymphs Abs: 1 10*3/uL (ref 0.7–4.0)
MCH: 31 pg (ref 26.0–34.0)
MCHC: 34.6 g/dL (ref 30.0–36.0)
MCV: 89.6 fL (ref 80.0–100.0)
Monocytes Absolute: 0.2 10*3/uL (ref 0.1–1.0)
Monocytes Relative: 4 %
Neutro Abs: 4.6 10*3/uL (ref 1.7–7.7)
Neutrophils Relative %: 78 %
Platelet Count: 169 10*3/uL (ref 150–400)
RBC: 4.52 MIL/uL (ref 4.22–5.81)
RDW: 12 % (ref 11.5–15.5)
WBC Count: 6 10*3/uL (ref 4.0–10.5)
nRBC: 0 % (ref 0.0–0.2)

## 2022-10-28 LAB — CMP (CANCER CENTER ONLY)
ALT: 17 U/L (ref 0–44)
AST: 17 U/L (ref 15–41)
Albumin: 4.7 g/dL (ref 3.5–5.0)
Alkaline Phosphatase: 99 U/L (ref 38–126)
Anion gap: 9 (ref 5–15)
BUN: 14 mg/dL (ref 6–20)
CO2: 25 mmol/L (ref 22–32)
Calcium: 10.1 mg/dL (ref 8.9–10.3)
Chloride: 108 mmol/L (ref 98–111)
Creatinine: 0.68 mg/dL (ref 0.61–1.24)
GFR, Estimated: >60 mL/min (ref >60)
Glucose, Bld: 137 mg/dL — ABNORMAL HIGH (ref 70–99)
Potassium: 3.7 mmol/L (ref 3.5–5.1)
Sodium: 142 mmol/L (ref 135–145)
Total Bilirubin: 0.4 mg/dL (ref 0.3–1.2)
Total Protein: 7.2 g/dL (ref 6.5–8.1)

## 2022-10-30 LAB — PROSTATE-SPECIFIC AG, SERUM (LABCORP): Prostate Specific Ag, Serum: <0.1 ng/mL (ref 0.0–4.0)

## 2022-11-04 ENCOUNTER — Inpatient Hospital Stay (HOSPITAL_BASED_OUTPATIENT_CLINIC_OR_DEPARTMENT_OTHER): Payer: BC Managed Care – PPO | Admitting: Oncology

## 2022-11-04 ENCOUNTER — Inpatient Hospital Stay: Payer: BC Managed Care – PPO

## 2022-11-04 VITALS — BP 140/90 | HR 90 | Temp 98.0°F | Resp 18 | Ht 69.0 in | Wt 187.0 lb

## 2022-11-04 DIAGNOSIS — Z5111 Encounter for antineoplastic chemotherapy: Secondary | ICD-10-CM | POA: Diagnosis not present

## 2022-11-04 DIAGNOSIS — C61 Malignant neoplasm of prostate: Secondary | ICD-10-CM

## 2022-11-04 DIAGNOSIS — C7951 Secondary malignant neoplasm of bone: Secondary | ICD-10-CM | POA: Diagnosis not present

## 2022-11-04 DIAGNOSIS — E876 Hypokalemia: Secondary | ICD-10-CM | POA: Diagnosis not present

## 2022-11-04 MED ORDER — LEUPROLIDE ACETATE (6 MONTH) 45 MG ~~LOC~~ KIT
45.0000 mg | PACK | Freq: Once | SUBCUTANEOUS | Status: AC
  Administered 2022-11-04: 45 mg via SUBCUTANEOUS
  Filled 2022-11-04: qty 45

## 2022-11-04 NOTE — Progress Notes (Signed)
Hematology and Oncology Follow Up   Frank [name] [id number] 1971-07-03 51 y.o. 11/04/2022 2:35 PM Frank [name], MDMorrow, Marjory Lies, MD       Principle Diagnosis: 51 year old man with advanced prostate cancer with lymphadenopathy and limited bone involvement diagnosed in 2019.  He has castration-sensitive after presenting with Gleason score 5+5 = 10 and a PSA of 27.8.   Prior Therapy:   He is status post prostate biopsy March 2019 which confirmed the diagnosis of Gleason score 5+5 = 10 prostate cancer.   Current therapy:   Eligard 45 mg every 6 months.  He will receive Eligard on November 04, 2022 and repeated in 6 months.  Zytiga 1000 mg prednisone 5 mg daily in April 2019.  The dose was reduced to 500 mg in September 2023.    Interim History: Frank [name] presents today for a follow-up visit.  Since last visit, he reports no major changes in his health.  He denies any nausea, vomiting or abdominal pain.  He did report some headaches which improved with reducing the dose to 500 mg total dose.  He still have issues with insomnia especially with prednisone daily dosing.  He denies any bone pain or pathological fractures.  He denies excessive fatigue or tiredness.   Medications: Updated on review.. Current Outpatient Medications  Medication Sig Dispense Refill   abiraterone acetate (ZYTIGA) 250 MG tablet Take 2 tablets (500 mg total) by mouth daily. Take on an empty stomach 1 hour before or 2 hours after a meal 60 tablet 2   calcium carbonate (OS-CAL) 600 MG TABS tablet TAKE 1 TABLET BY MOUTH TWICE A DAY WITH A MEAL 150 tablet 3   [facility] CALCIUM 600 MG tablet Take 1 tablet by mouth 2 (two) times daily.     lansoprazole (PREVACID) 15 MG capsule Take 15 mg by mouth daily at 12 noon.     predniSONE (DELTASONE) 5 MG tablet TAKE 1 TABLET BY MOUTH EVERY DAY WITH BREAKFAST 30 tablet 12   valACYclovir (VALTREX) 500 MG tablet Take 500 mg by mouth 2 (two) times daily.     No current  facility-administered medications for this visit.     Allergies: No Known Allergies    Physical exam:    Blood pressure (!) 140/90, pulse 90, temperature 98 F (36.7 C), temperature source Temporal, resp. rate 18, height '5\' 9"'$  (1.753 m), weight 187 lb (84.8 kg), SpO2 98 %.    ECOG 0   General appearance: Comfortable appearing without any discomfort Head: Normocephalic without any trauma Oropharynx: Mucous membranes are moist and pink without any thrush or ulcers. Eyes: Pupils are equal and round reactive to light. Lymph nodes: No cervical, supraclavicular, inguinal or axillary lymphadenopathy.   Heart:regular rate and rhythm.  S1 and S2 without leg edema. Lung: Clear without any rhonchi or wheezes.  No dullness to percussion. Abdomin: Soft, nontender, nondistended with good bowel sounds.  No hepatosplenomegaly. Musculoskeletal: No joint deformity or effusion.  Full range of motion noted. Neurological: No deficits noted on motor, sensory and deep tendon reflex exam. Skin: No petechial rash or dryness.  Appeared moist.           Lab Results: Lab Results  Component Value Date   WBC 6.0 10/28/2022   HGB 14.0 10/28/2022   HCT 40.5 10/28/2022   MCV 89.6 10/28/2022   PLT 169 10/28/2022     Chemistry      Component Value Date/Time   NA 142 10/28/2022 1245   K 3.7 10/28/2022  1245   CL 108 10/28/2022 1245   CO2 25 10/28/2022 1245   BUN 14 10/28/2022 1245   CREATININE 0.68 10/28/2022 1245      Component Value Date/Time   CALCIUM 10.1 10/28/2022 1245   ALKPHOS 99 10/28/2022 1245   AST 17 10/28/2022 1245   ALT 17 10/28/2022 1245   BILITOT 0.4 10/28/2022 1245       Latest Reference Range & Units 10/29/21 07:55 04/22/22 08:42 10/28/22 12:45  Prostate Specific Ag, Serum 0.0 - 4.0 ng/mL <0.1 <0.1 <0.1      Impression and Plan:   51 year old man with the:   1.   Advanced prostate cancer with lymphadenopathy diagnosed in 2019.  He has limited bone  involvement and currently has castration-sensitive disease.  The natural course of his disease and treatment choices were reviewed.  His PSA continues to be undetectable with imaging studies in June 2023 showed no evidence of metastasis noted using conventional imaging however.  Therapy descalation options were discussed at this time and this method is not proven at this time but could be considered if he has symptoms related to [facility].  After discussion today, I opted to continue with Zytiga 500 mg due to better tolerance.  He will also discontinue prednisone permanently due to side effects.  I recommended obtaining imaging studies in 6 months with preferably PSMA PET scan.   Alternative treatment options if he developed castration-resistant disease include Taxotere chemotherapy, PARP inhibitor if he harbors the appropriate mutation among other options.  2.  Androgen depravation: I recommended continuing Eligard indefinitely.  Complications that include hot flashes and weight gain and sexual dysfunction were reviewed.   3. Bone directed therapy: He is to continue calcium and vitamin D supplements to combat osteoporosis.  He has no disease involvement at this time and Delton See has been deferred.    4.  Goals of care: Therapy remains palliative although aggressive measures are warranted given his excellent performance status.   5.  Hypokalemia: Related to his fatigue with potassium within normal range.    6.  Follow-up: In 6 months for repeat follow-up.     30  minutes were spent on this visit.  The time was dedicated to reviewing laboratory data, disease status update and outlining future plan of care discussion.  [name], MD 11/04/2022 2:35 PM

## 2022-11-16 ENCOUNTER — Other Ambulatory Visit: Payer: Self-pay | Admitting: Oncology

## 2022-11-16 ENCOUNTER — Other Ambulatory Visit (HOSPITAL_COMMUNITY): Payer: Self-pay

## 2022-11-16 DIAGNOSIS — C61 Malignant neoplasm of prostate: Secondary | ICD-10-CM

## 2022-11-16 MED ORDER — ABIRATERONE ACETATE 250 MG PO TABS
500.0000 mg | ORAL_TABLET | Freq: Every day | ORAL | 2 refills | Status: DC
  Filled 2022-11-16: qty 60, 30d supply, fill #0
  Filled 2022-12-09: qty 60, 30d supply, fill #1
  Filled 2023-01-14: qty 60, 30d supply, fill #2

## 2022-11-23 ENCOUNTER — Other Ambulatory Visit (HOSPITAL_COMMUNITY): Payer: Self-pay

## 2022-11-23 ENCOUNTER — Other Ambulatory Visit: Payer: Self-pay

## 2022-11-24 ENCOUNTER — Other Ambulatory Visit: Payer: Self-pay

## 2022-11-24 ENCOUNTER — Other Ambulatory Visit (HOSPITAL_COMMUNITY): Payer: Self-pay

## 2022-12-09 ENCOUNTER — Other Ambulatory Visit (HOSPITAL_COMMUNITY): Payer: Self-pay

## 2022-12-17 ENCOUNTER — Other Ambulatory Visit (HOSPITAL_COMMUNITY): Payer: Self-pay

## 2022-12-21 ENCOUNTER — Other Ambulatory Visit (HOSPITAL_COMMUNITY): Payer: Self-pay

## 2022-12-22 ENCOUNTER — Other Ambulatory Visit: Payer: Self-pay

## 2023-01-11 ENCOUNTER — Other Ambulatory Visit (HOSPITAL_COMMUNITY): Payer: Self-pay

## 2023-01-14 ENCOUNTER — Other Ambulatory Visit (HOSPITAL_COMMUNITY): Payer: Self-pay

## 2023-01-18 ENCOUNTER — Other Ambulatory Visit: Payer: Self-pay

## 2023-01-18 ENCOUNTER — Other Ambulatory Visit (HOSPITAL_COMMUNITY): Payer: Self-pay

## 2023-01-19 ENCOUNTER — Other Ambulatory Visit (HOSPITAL_COMMUNITY): Payer: Self-pay

## 2023-02-10 ENCOUNTER — Other Ambulatory Visit: Payer: Self-pay | Admitting: Oncology

## 2023-02-10 ENCOUNTER — Other Ambulatory Visit (HOSPITAL_COMMUNITY): Payer: Self-pay

## 2023-02-10 DIAGNOSIS — C61 Malignant neoplasm of prostate: Secondary | ICD-10-CM

## 2023-02-11 ENCOUNTER — Other Ambulatory Visit (HOSPITAL_COMMUNITY): Payer: Self-pay

## 2023-02-11 ENCOUNTER — Other Ambulatory Visit: Payer: Self-pay

## 2023-02-11 ENCOUNTER — Other Ambulatory Visit: Payer: Self-pay | Admitting: Hematology

## 2023-02-11 DIAGNOSIS — C61 Malignant neoplasm of prostate: Secondary | ICD-10-CM

## 2023-02-11 MED ORDER — ABIRATERONE ACETATE 250 MG PO TABS
500.0000 mg | ORAL_TABLET | Freq: Every day | ORAL | 0 refills | Status: DC
  Filled 2023-02-11: qty 60, 30d supply, fill #0

## 2023-02-15 ENCOUNTER — Other Ambulatory Visit (HOSPITAL_COMMUNITY): Payer: Self-pay

## 2023-02-18 ENCOUNTER — Other Ambulatory Visit (HOSPITAL_COMMUNITY): Payer: Self-pay

## 2023-02-18 ENCOUNTER — Telehealth: Payer: Self-pay | Admitting: Hematology

## 2023-02-18 NOTE — Telephone Encounter (Signed)
Contacted patient to scheduled appointments. Left message with appointment details and a call back number if patient had any questions or could not accommodate the time we provided.   

## 2023-02-21 ENCOUNTER — Telehealth: Payer: Self-pay | Admitting: Hematology

## 2023-02-21 ENCOUNTER — Other Ambulatory Visit: Payer: Self-pay

## 2023-02-21 ENCOUNTER — Other Ambulatory Visit (HOSPITAL_COMMUNITY): Payer: Self-pay

## 2023-02-21 NOTE — Telephone Encounter (Signed)
Patient called to move appointment to June 3rd originally June 14th but moved per note.

## 2023-02-22 ENCOUNTER — Other Ambulatory Visit: Payer: Self-pay

## 2023-02-24 ENCOUNTER — Telehealth: Payer: Self-pay | Admitting: Hematology

## 2023-02-24 NOTE — Telephone Encounter (Signed)
Reached out to patient per 4/3 IB , reschedule patient. Patient aware of date and time of appointment.

## 2023-03-10 ENCOUNTER — Telehealth: Payer: Self-pay | Admitting: Hematology

## 2023-03-10 NOTE — Telephone Encounter (Signed)
Contacted patient to scheduled appointments. Patient is aware of appointments that are scheduled.   

## 2023-03-11 ENCOUNTER — Inpatient Hospital Stay: Payer: BC Managed Care – PPO | Attending: Nurse Practitioner

## 2023-03-11 ENCOUNTER — Inpatient Hospital Stay: Payer: BC Managed Care – PPO

## 2023-03-11 ENCOUNTER — Other Ambulatory Visit: Payer: Self-pay

## 2023-03-11 ENCOUNTER — Inpatient Hospital Stay: Payer: BC Managed Care – PPO | Admitting: Hematology

## 2023-03-11 ENCOUNTER — Other Ambulatory Visit (HOSPITAL_COMMUNITY): Payer: Self-pay

## 2023-03-11 ENCOUNTER — Inpatient Hospital Stay (HOSPITAL_BASED_OUTPATIENT_CLINIC_OR_DEPARTMENT_OTHER): Payer: BC Managed Care – PPO | Admitting: Hematology

## 2023-03-11 ENCOUNTER — Encounter: Payer: Self-pay | Admitting: Hematology

## 2023-03-11 VITALS — BP 147/91 | HR 84 | Temp 97.9°F | Resp 16 | Ht 69.0 in | Wt 182.9 lb

## 2023-03-11 DIAGNOSIS — C7951 Secondary malignant neoplasm of bone: Secondary | ICD-10-CM | POA: Insufficient documentation

## 2023-03-11 DIAGNOSIS — G8929 Other chronic pain: Secondary | ICD-10-CM | POA: Diagnosis not present

## 2023-03-11 DIAGNOSIS — C61 Malignant neoplasm of prostate: Secondary | ICD-10-CM | POA: Diagnosis not present

## 2023-03-11 DIAGNOSIS — R519 Headache, unspecified: Secondary | ICD-10-CM | POA: Diagnosis not present

## 2023-03-11 LAB — CMP (CANCER CENTER ONLY)
ALT: 16 U/L (ref 0–44)
AST: 16 U/L (ref 15–41)
Albumin: 4.8 g/dL (ref 3.5–5.0)
Alkaline Phosphatase: 157 U/L — ABNORMAL HIGH (ref 38–126)
Anion gap: 8 (ref 5–15)
BUN: 13 mg/dL (ref 6–20)
CO2: 26 mmol/L (ref 22–32)
Calcium: 10.2 mg/dL (ref 8.9–10.3)
Chloride: 109 mmol/L (ref 98–111)
Creatinine: 0.81 mg/dL (ref 0.61–1.24)
GFR, Estimated: >60 mL/min (ref >60)
Glucose, Bld: 104 mg/dL — ABNORMAL HIGH (ref 70–99)
Potassium: 3.5 mmol/L (ref 3.5–5.1)
Sodium: 143 mmol/L (ref 135–145)
Total Bilirubin: 0.6 mg/dL (ref 0.3–1.2)
Total Protein: 7.6 g/dL (ref 6.5–8.1)

## 2023-03-11 LAB — CBC WITH DIFFERENTIAL (CANCER CENTER ONLY)
Abs Immature Granulocytes: 0.02 10*3/uL (ref 0.00–0.07)
Basophils Absolute: 0 10*3/uL (ref 0.0–0.1)
Basophils Relative: 1 %
Eosinophils Absolute: 0.3 10*3/uL (ref 0.0–0.5)
Eosinophils Relative: 8 %
HCT: 44.2 % (ref 39.0–52.0)
Hemoglobin: 15 g/dL (ref 13.0–17.0)
Immature Granulocytes: 0 %
Lymphocytes Relative: 31 %
Lymphs Abs: 1.4 10*3/uL (ref 0.7–4.0)
MCH: 30.1 pg (ref 26.0–34.0)
MCHC: 33.9 g/dL (ref 30.0–36.0)
MCV: 88.8 fL (ref 80.0–100.0)
Monocytes Absolute: 0.3 10*3/uL (ref 0.1–1.0)
Monocytes Relative: 6 %
Neutro Abs: 2.5 10*3/uL (ref 1.7–7.7)
Neutrophils Relative %: 54 %
Platelet Count: 205 10*3/uL (ref 150–400)
RBC: 4.98 MIL/uL (ref 4.22–5.81)
RDW: 11.9 % (ref 11.5–15.5)
WBC Count: 4.5 10*3/uL (ref 4.0–10.5)
nRBC: 0 % (ref 0.0–0.2)

## 2023-03-11 MED ORDER — ABIRATERONE ACETATE 250 MG PO TABS
500.0000 mg | ORAL_TABLET | Freq: Every day | ORAL | 1 refills | Status: DC
Start: 2023-03-11 — End: 2023-04-19
  Filled 2023-03-11 (×2): qty 60, 30d supply, fill #0
  Filled 2023-03-30: qty 60, 30d supply, fill #1

## 2023-03-11 NOTE — Progress Notes (Signed)
[name]   Telephone:(336) [phone] Fax:(336) [phone]   Clinic Follow up Note   Patient Care Team: Farris Has, MD as PCP - General (Family Medicine) [name], MD as Attending Physician (Hematology and Oncology)  Date of Service:  03/11/2023  CHIEF COMPLAINT: f/u of prostate cancer  CURRENT THERAPY:  Eligard 45 mg every 6 months Zytiga 1000 mg and prednisone 5 mg daily since April 2019.  Zytiga dose was reduced to 500 mg in September 2023, he is also off prednisone now.  ASSESSMENT:  Frank [name] is a 52 y.o. 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 -Diagnosed in March 2019.  Gleason score 5+5 = 10, baseline PSA 27.8 -He had excellent response to treatment, PSA has been undetectable, last checked in December 2023. -Last imaging with CT and bone scan in January 2023 showed no evidence of disease -He is scheduled for PSMA PET scan in June 2024 -Biphosphonate and has been deferred due to no evidence of active bone metastasis. -We reviewed the nature course of metastatic prostate cancer, and treatment options, especially if he progresses on current therapy. -We also discussed the option of discontinue Zytiga (not standard) and continue ADT indefinitely given the excellent disease control.  He is tolerating Zytiga well, he opts to continue current therapy at this point.  -He is scheduled for early PSMA PET scan in June, if Negative, may consider coming off Zytiga.   Headache  -He has had intermittent migraine type headaches since the change Zytiga to generic abiraterone in late 2023.  -He has no other neurological symptoms, PSA has been undetectable, I have low suspicion his headache is related to his malignancy.  I do not feel he needs brain imaging. -I discussed neurology referral, he agrees.  PLAN: -We will continue abiraterone 500 mg daily, I refilled to [facility] today -Follow-up  in June after PSMA PET scan -He is due for Eligard injection in mid June.   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  The Hereditary Gene Panel offered by [facility] includes sequencing and/or deletion duplication testing of the following 47 genes: APC, ATM, AXIN2, BARD1, BMPR1A, BRCA1, BRCA2, BRIP1, CDH1, CDK4, CDKN2A (p14ARF), CDKN2A (p16INK4a), CHEK2, CTNNA1, DICER1, EPCAM (Deletion/duplication testing only), GREM1 (promoter region deletion/duplication testing only), KIT, MEN1, MLH1, MSH2, MSH3, MSH6, MUTYH, NBN, NF1, NHTL1, PALB2, PDGFRA, PMS2, POLD1, POLE, PTEN, RAD50, RAD51C, RAD51D, SDHB, SDHC, SDHD, SMAD4, SMARCA4. STK11, TP53, TSC1, TSC2, and VHL.  The following genes were evaluated for sequence changes only: SDHA and HOXB13 c.251G>A variant only. The report date is Apr 20, 2018.  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  The updated report date is December 17, 2020      INTERVAL HISTORY:  Frank [name] is here for a follow up of prostate cancer. He was last seen by Dr. Clelia Croft on November 04, 2022. He presents to the clinic accompanied by his wife.  He was last seen by Dr. Clelia Croft on November 04, 2022.  This is my first time to meet him.  He is overall doing well, he is tolerating amiodarone well overall, but does report intermittent migraine type headaches since he changed Zytiga to generic in August 2023.  He denies any other neurological  symptoms.  He has some mild to moderate fatigue, but able to function well at home, and does play golf and walks, no other weightbearing exercise.  He does have mild joint achiness and stiffness in the morning, improves throughout the day.  This is tolerable, he is not on any pain medication.  His  appetite is good, weight is stable.   All other systems were reviewed with the patient and are negative.  MEDICAL HISTORY:  Past Medical History:  Diagnosis Date   Family history of colon cancer    Prostate CA [facility])    Prostate cancer (HCC) dx'd 02/2018    SURGICAL HISTORY: Past Surgical History:  Procedure Laterality Date   PROSTATE BIOPSY  02/01/2018   TRANSRECTAL ULTRASOUND  02/01/2018    I have reviewed the social history and family history with the patient and they are unchanged from previous note.  ALLERGIES:  has No Known Allergies.  MEDICATIONS:  Current Outpatient Medications  Medication Sig Dispense Refill   abiraterone acetate (ZYTIGA) 250 MG tablet Take 2 tablets (500 mg total) by mouth daily. Take on an empty stomach 1 hour before or 2 hours after a meal 60 tablet 0   calcium carbonate (OS-CAL) 600 MG TABS tablet TAKE 1 TABLET BY MOUTH TWICE A DAY WITH A MEAL 150 tablet 3   CVS CALCIUM 600 MG tablet Take 1 tablet by mouth 2 (two) times daily.     lansoprazole (PREVACID) 15 MG capsule Take 15 mg by mouth daily at 12 noon.     valACYclovir (VALTREX) 500 MG tablet Take 500 mg by mouth 2 (two) times daily.     No current facility-administered medications for this visit.    PHYSICAL EXAMINATION: ECOG PERFORMANCE STATUS: 1 - Symptomatic but completely ambulatory  There were no vitals filed for this visit. Wt Readings from Last 3 Encounters:  11/04/22 187 lb (84.8 kg)  04/29/22 187 lb 6.4 oz (85 kg)  10/29/21 188 lb 8 oz (85.5 kg)     GENERAL:alert, no distress and comfortable SKIN: skin color, texture, turgor are normal, no rashes or significant lesions EYES: normal, Conjunctiva are pink and non-injected, sclera clear NECK: supple, thyroid normal size, non-tender, without nodularity LYMPH:  no palpable lymphadenopathy in the cervical, axillary  LUNGS: clear to auscultation and percussion with normal breathing effort HEART: regular rate & rhythm and no murmurs  and no lower extremity edema ABDOMEN:abdomen soft, non-tender and normal bowel sounds Musculoskeletal:no cyanosis of digits and no clubbing  NEURO: alert & oriented x 3 with fluent speech, no focal motor/sensory deficits  LABORATORY DATA:  I have reviewed the data as listed    Latest Ref Rng & Units 10/28/2022   12:45 PM 04/22/2022    8:42 AM 10/29/2021    7:55 AM  CBC  WBC 4.0 - 10.5 K/uL 6.0  5.0  4.9   Hemoglobin 13.0 - 17.0 g/dL 21.3  08.6  57.8   Hematocrit 39.0 - 52.0 % 40.5  41.5  42.7   Platelets 150 - 400 K/uL 169  186  184         Latest Ref Rng & Units 10/28/2022   12:45 PM 04/22/2022    8:42 AM 10/29/2021    7:55 AM  CMP  Glucose 70 - 99 mg/dL 469  629  528   BUN 6 - 20 mg/dL Creatinine 0.61 - 1.24 mg/dL 4.13  2.44  0.10   Sodium 135 - 145 mmol/L  142  142  144   Potassium 3.5 - 5.1 mmol/L 3.7  3.9  3.5   Chloride 98 - 111 mmol/L 108  108  110   CO2 22 - 32 mmol/L Calcium 8.9 - 10.3 mg/dL 78.4  69.6  9.3   Total Protein 6.5 - 8.1 g/dL 7.2  7.2  7.2   Total Bilirubin 0.3 - 1.2 mg/dL 0.4  0.7  0.6   Alkaline Phos 38 - 126 U/L 99  102  131   AST 15 - 41 U/L ALT 0 - 44 U/L RADIOGRAPHIC STUDIES: I have personally reviewed the radiological images as listed and agreed with the findings in the report. No results found.    No orders of the defined types were placed in this encounter.  All questions were answered. The patient knows to call the clinic with any problems, questions or concerns. No barriers to learning was detected. The total time spent in the appointment was 40 minutes.     [name], MD 03/11/2023

## 2023-03-12 LAB — PROSTATE-SPECIFIC AG, SERUM (LABCORP): Prostate Specific Ag, Serum: <0.1 ng/mL (ref 0.0–4.0)

## 2023-03-16 ENCOUNTER — Encounter: Payer: Self-pay | Admitting: Hematology

## 2023-03-21 ENCOUNTER — Other Ambulatory Visit: Payer: Self-pay

## 2023-03-22 ENCOUNTER — Other Ambulatory Visit: Payer: Self-pay

## 2023-03-22 ENCOUNTER — Other Ambulatory Visit (HOSPITAL_COMMUNITY): Payer: Self-pay

## 2023-03-29 ENCOUNTER — Other Ambulatory Visit (HOSPITAL_COMMUNITY): Payer: Self-pay

## 2023-03-30 ENCOUNTER — Encounter: Payer: Self-pay | Admitting: Hematology

## 2023-03-30 ENCOUNTER — Other Ambulatory Visit: Payer: Self-pay

## 2023-03-30 ENCOUNTER — Other Ambulatory Visit (HOSPITAL_COMMUNITY): Payer: Self-pay

## 2023-03-30 ENCOUNTER — Telehealth: Payer: Self-pay | Admitting: Pharmacy Technician

## 2023-03-30 NOTE — Telephone Encounter (Signed)
Oral Oncology Patient Advocate Encounter  Received information that patient is having headaches possibly as a side effect of the NDC of abiraterone that he gets from the pharmacy at [facility]. I reviewed his fill history and saw that he has been receiving the APOTEX manufacturer due to a copay card that only works with their NDC.  [facility] pharmacy carries an additional manufacturer, [employer], that the patient may try, however the copay card they have been utilizing will not be valid for this NDC.   Patient was sent a refill of the APOTEX abiraterone on 03/22/23. I have spoken with his [employer] plan to see if there are any overrides available to allow an early refill of the medication with the alternate manufacturer.  [employer] pharamcy line: [phone]  Patient's copay is [id number]  Delford Field pricing is available at [facility] for $70  I have spoken with the patient and they would like to proceed with trying the NOVADOZ ndc using the cash price. Medication set to be delivered on 03/28/23.  Jinger Neighbors, CPhT-Adv Oncology Pharmacy Patient Advocate [facility] Direct Number: [phone]  Fax: [phone]

## 2023-04-19 ENCOUNTER — Other Ambulatory Visit: Payer: Self-pay

## 2023-04-19 ENCOUNTER — Ambulatory Visit: Payer: BC Managed Care – PPO

## 2023-04-19 ENCOUNTER — Other Ambulatory Visit: Payer: Self-pay | Admitting: Hematology

## 2023-04-19 ENCOUNTER — Other Ambulatory Visit (HOSPITAL_COMMUNITY): Payer: Self-pay

## 2023-04-19 DIAGNOSIS — C61 Malignant neoplasm of prostate: Secondary | ICD-10-CM

## 2023-04-19 MED ORDER — ABIRATERONE ACETATE 250 MG PO TABS
500.0000 mg | ORAL_TABLET | Freq: Every day | ORAL | 1 refills | Status: DC
Start: 2023-04-19 — End: 2023-04-19
  Filled ????-??-??: fill #0

## 2023-04-19 MED ORDER — ABIRATERONE ACETATE 250 MG PO TABS
500.0000 mg | ORAL_TABLET | Freq: Every day | ORAL | 0 refills | Status: DC
Start: 2023-04-19 — End: 2023-05-02
  Filled 2023-04-19 – 2023-04-26 (×2): qty 60, 30d supply, fill #0

## 2023-04-20 ENCOUNTER — Telehealth: Payer: Self-pay

## 2023-04-20 ENCOUNTER — Other Ambulatory Visit: Payer: Self-pay

## 2023-04-20 NOTE — Telephone Encounter (Signed)
Spoke with pt via telephone to inform pt that his insurance has denied approval for his upcoming scheduled PET Scan; therefore, the appt will need to be cancelled to prevent incurring a bill.  Informed pt that Dr. Mosetta Putt will most likely order other diagnostic test/s that hopefully his insurance will approve.  Informed pt once the other diagnostic test/s have been approved by his insurance, someone from [employer] will be contacting him to get him scheduled.  Pt verbalized understanding and asked if he could speak with Dr. Latanya Maudlin scheduler to reschedule his injection and OV appts scheduled on 04/29/2023 to 05/02/2023 since he will not have the PET Scan done.  Transferred pt to Lynda Rainwater to reschedule his appts.

## 2023-04-25 ENCOUNTER — Ambulatory Visit: Payer: BC Managed Care – PPO

## 2023-04-26 ENCOUNTER — Other Ambulatory Visit (HOSPITAL_COMMUNITY): Payer: BC Managed Care – PPO

## 2023-04-26 ENCOUNTER — Other Ambulatory Visit (HOSPITAL_COMMUNITY): Payer: Self-pay

## 2023-04-26 ENCOUNTER — Inpatient Hospital Stay: Payer: BC Managed Care – PPO | Attending: Nurse Practitioner

## 2023-04-26 ENCOUNTER — Other Ambulatory Visit: Payer: Self-pay

## 2023-04-26 DIAGNOSIS — Z5111 Encounter for antineoplastic chemotherapy: Secondary | ICD-10-CM | POA: Insufficient documentation

## 2023-04-26 DIAGNOSIS — Z800 Family history of malignant neoplasm of digestive organs: Secondary | ICD-10-CM | POA: Insufficient documentation

## 2023-04-26 DIAGNOSIS — C61 Malignant neoplasm of prostate: Secondary | ICD-10-CM | POA: Diagnosis not present

## 2023-04-26 DIAGNOSIS — C7951 Secondary malignant neoplasm of bone: Secondary | ICD-10-CM | POA: Insufficient documentation

## 2023-04-26 LAB — CBC WITH DIFFERENTIAL (CANCER CENTER ONLY)
Abs Immature Granulocytes: 0.02 10*3/uL (ref 0.00–0.07)
Basophils Absolute: 0.1 10*3/uL (ref 0.0–0.1)
Basophils Relative: 1 %
Eosinophils Absolute: 0.2 10*3/uL (ref 0.0–0.5)
Eosinophils Relative: 5 %
HCT: 41.1 % (ref 39.0–52.0)
Hemoglobin: 14.4 g/dL (ref 13.0–17.0)
Immature Granulocytes: 0 %
Lymphocytes Relative: 31 %
Lymphs Abs: 1.5 10*3/uL (ref 0.7–4.0)
MCH: 30.6 pg (ref 26.0–34.0)
MCHC: 35 g/dL (ref 30.0–36.0)
MCV: 87.4 fL (ref 80.0–100.0)
Monocytes Absolute: 0.3 10*3/uL (ref 0.1–1.0)
Monocytes Relative: 6 %
Neutro Abs: 2.9 10*3/uL (ref 1.7–7.7)
Neutrophils Relative %: 57 %
Platelet Count: 187 10*3/uL (ref 150–400)
RBC: 4.7 MIL/uL (ref 4.22–5.81)
RDW: 12.1 % (ref 11.5–15.5)
WBC Count: 5 10*3/uL (ref 4.0–10.5)
nRBC: 0 % (ref 0.0–0.2)

## 2023-04-26 LAB — CMP (CANCER CENTER ONLY)
ALT: 15 U/L (ref 0–44)
AST: 16 U/L (ref 15–41)
Albumin: 4.5 g/dL (ref 3.5–5.0)
Alkaline Phosphatase: 157 U/L — ABNORMAL HIGH (ref 38–126)
Anion gap: 6 (ref 5–15)
BUN: 14 mg/dL (ref 6–20)
CO2: 26 mmol/L (ref 22–32)
Calcium: 9.6 mg/dL (ref 8.9–10.3)
Chloride: 110 mmol/L (ref 98–111)
Creatinine: 0.81 mg/dL (ref 0.61–1.24)
GFR, Estimated: >60 mL/min (ref >60)
Glucose, Bld: 115 mg/dL — ABNORMAL HIGH (ref 70–99)
Potassium: 4 mmol/L (ref 3.5–5.1)
Sodium: 142 mmol/L (ref 135–145)
Total Bilirubin: 0.5 mg/dL (ref 0.3–1.2)
Total Protein: 7.1 g/dL (ref 6.5–8.1)

## 2023-04-28 LAB — PROSTATE-SPECIFIC AG, SERUM (LABCORP): Prostate Specific Ag, Serum: <0.1 ng/mL (ref 0.0–4.0)

## 2023-04-29 ENCOUNTER — Ambulatory Visit: Payer: BC Managed Care – PPO

## 2023-04-29 ENCOUNTER — Ambulatory Visit: Payer: BC Managed Care – PPO | Admitting: Hematology

## 2023-05-02 ENCOUNTER — Other Ambulatory Visit: Payer: Self-pay

## 2023-05-02 ENCOUNTER — Inpatient Hospital Stay: Payer: BC Managed Care – PPO

## 2023-05-02 ENCOUNTER — Encounter: Payer: Self-pay | Admitting: Hematology

## 2023-05-02 ENCOUNTER — Inpatient Hospital Stay: Payer: BC Managed Care – PPO | Admitting: Hematology

## 2023-05-02 VITALS — BP 163/105 | HR 74 | Temp 98.2°F | Resp 18

## 2023-05-02 VITALS — BP 160/100 | HR 82 | Temp 97.6°F | Resp 14 | Ht 69.0 in | Wt 180.3 lb

## 2023-05-02 DIAGNOSIS — C61 Malignant neoplasm of prostate: Secondary | ICD-10-CM

## 2023-05-02 DIAGNOSIS — C7951 Secondary malignant neoplasm of bone: Secondary | ICD-10-CM | POA: Diagnosis not present

## 2023-05-02 DIAGNOSIS — Z5111 Encounter for antineoplastic chemotherapy: Secondary | ICD-10-CM | POA: Diagnosis not present

## 2023-05-02 DIAGNOSIS — Z800 Family history of malignant neoplasm of digestive organs: Secondary | ICD-10-CM | POA: Diagnosis not present

## 2023-05-02 MED ORDER — LEUPROLIDE ACETATE (6 MONTH) 45 MG ~~LOC~~ KIT
45.0000 mg | PACK | Freq: Once | SUBCUTANEOUS | Status: AC
  Administered 2023-05-02: 45 mg via SUBCUTANEOUS
  Filled 2023-05-02: qty 45

## 2023-05-02 MED ORDER — ABIRATERONE ACETATE 250 MG PO TABS
500.0000 mg | ORAL_TABLET | Freq: Every day | ORAL | 5 refills | Status: DC
  Filled 2023-05-02 – 2023-05-18 (×2): qty 60, 30d supply, fill #0
  Filled 2023-06-17: qty 60, 30d supply, fill #1

## 2023-05-02 NOTE — Progress Notes (Signed)
[facility]   Telephone:(336) [phone] Fax:(336) [phone]   Clinic Follow up Note   Patient Care Team: Farris Has, MD as PCP - General (Family Medicine) [name], MD as Attending Physician (Hematology and Oncology)  Date of Service:  05/02/2023  CHIEF COMPLAINT: f/u of  prostate cancer    CURRENT THERAPY:  Eligard 45 mg every 6 months Zytiga 1000 mg and prednisone 5 mg daily since April 2019.  Zytiga dose was reduced to 500 mg in September 2023, he is also off prednisone now.    ASSESSMENT:  Frank [name] is a 52 y.o. 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 -Diagnosed in March 2019.  Gleason score 5+5 = 10, baseline PSA 27.8 -He had excellent response to treatment, PSA has been undetectable, last checked in December 2023. -Last imaging with CT and bone scan in January 2023 showed no evidence of disease -He is scheduled for PSMA PET scan in June 2024 -Biphosphonate and has been deferred due to no evidence of active bone metastasis. -We reviewed the nature course of metastatic prostate cancer, and treatment options, especially if he progresses on current therapy. -We also discussed the option of discontinue Zytiga (not standard) and continue ADT indefinitely given the excellent disease control.  He is tolerating Zytiga well, he opts to continue current therapy at this point.  -His insurance denied restaging PSMA PET scan in June 2024 -We discussed continue current therapy vs coming off Zytiga.  We discussed the pros and cons, patient decides to continue Zytiga and Eligard -f/u in 6 months, he knows what to watch and will contact us if he has any concerns before his next appointment.   PLAN: -lab reviewed. 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  The Hereditary Gene Panel offered by [facility] includes sequencing and/or deletion duplication testing of the following 47 genes: APC, ATM, AXIN2, BARD1, BMPR1A, BRCA1, BRCA2, BRIP1, CDH1, CDK4, CDKN2A (p14ARF), CDKN2A (p16INK4a), CHEK2, CTNNA1, DICER1, EPCAM (Deletion/duplication testing only), GREM1 (promoter region deletion/duplication testing only), KIT, MEN1, MLH1, MSH2, MSH3, MSH6, MUTYH, NBN, NF1, NHTL1, PALB2, PDGFRA, PMS2, POLD1, POLE, PTEN, RAD50, RAD51C, RAD51D, SDHB, SDHC, SDHD, SMAD4, SMARCA4. STK11, TP53, TSC1, TSC2, and VHL.  The following genes were evaluated for sequence changes only: SDHA and HOXB13 c.251G>A variant only. The report date is Apr 20, 2018.  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  The updated report date is December 17, 2020      INTERVAL HISTORY:  Frank [name] is here for a follow up of  prostate cancer. He was last seen by me on 03/11/2023. He presents to the clinic accompanied by wife. Pt state that he has no discomfort, but his energy level is low. Pt state that his headaches have subsided.     All other systems were reviewed with the patient and are negative.  MEDICAL HISTORY:  Past Medical History:  Diagnosis Date   Family history of colon cancer    Prostate CA (  HCC)    Prostate cancer (HCC) dx'd 02/2018    SURGICAL HISTORY: Past Surgical History:  Procedure Laterality Date   PROSTATE BIOPSY  02/01/2018   TRANSRECTAL ULTRASOUND  02/01/2018    I have reviewed the social history and family history with the patient and they are unchanged from previous note.  ALLERGIES:  has No Known  Allergies.  MEDICATIONS:  Current Outpatient Medications  Medication Sig Dispense Refill   abiraterone acetate (ZYTIGA) 250 MG tablet Take 2 tablets (500 mg total) by mouth daily. Take on an empty stomach 1 hour before or 2 hours after a meal 60 tablet 5   calcium carbonate (OS-CAL) 600 MG TABS tablet TAKE 1 TABLET BY MOUTH TWICE A DAY WITH A MEAL 150 tablet 3   CVS CALCIUM 600 MG tablet Take 1 tablet by mouth 2 (two) times daily.     lansoprazole (PREVACID) 15 MG capsule Take 15 mg by mouth daily at 12 noon.     valACYclovir (VALTREX) 500 MG tablet Take 500 mg by mouth 2 (two) times daily.     No current facility-administered medications for this visit.    PHYSICAL EXAMINATION: ECOG PERFORMANCE STATUS: 1 - Symptomatic but completely ambulatory  Vitals:   05/02/23 1226  BP: (!) 160/100  Pulse: 82  Resp: 14  Temp: 97.6 F (36.4 C)  SpO2: 97%   Wt Readings from Last 3 Encounters:  05/02/23 180 lb 4.8 oz (81.8 kg)  03/11/23 182 lb 14.4 oz (83 kg)  11/04/22 187 lb (84.8 kg)     GENERAL:alert, no distress and comfortable SKIN: skin color normal, no rashes or significant lesions EYES: normal, Conjunctiva are pink and non-injected, sclera clear  NEURO: alert & oriented x 3 with fluent speech   LABORATORY DATA:  I have reviewed the data as listed    Latest Ref Rng & Units 04/26/2023   12:16 PM 03/11/2023    9:21 AM 10/28/2022   12:45 PM  CBC  WBC 4.0 - 10.5 K/uL 5.0  4.5  6.0   Hemoglobin 13.0 - 17.0 g/dL 16.1  09.6  04.5   Hematocrit 39.0 - 52.0 % 41.1  44.2  40.5   Platelets 150 - 400 K/uL 187  205  169         Latest Ref Rng & Units 04/26/2023   12:16 PM 03/11/2023    9:21 AM 10/28/2022   12:45 PM  CMP  Glucose 70 - 99 mg/dL 409  811  914   BUN 6 - 20 mg/dL 14  13  14    Creatinine 0.61 - 1.24 mg/dL 7.82  9.56  2.13   Sodium 135 - 145 mmol/L 142  143  142   Potassium 3.5 - 5.1 mmol/L 4.0  3.5  3.7   Chloride 98 - 111 mmol/L 110  109  108   CO2 22 - 32 mmol/L 26  26   25    Calcium 8.9 - 10.3 mg/dL 9.6  08.6  57.8   Total Protein 6.5 - 8.1 g/dL 7.1  7.6  7.2   Total Bilirubin 0.3 - 1.2 mg/dL 0.5  0.6  0.4   Alkaline Phos 38 - 126 U/L 157  157  99   AST 15 - 41 U/L 16  16  17    ALT 0 - 44 U/L 15  16  17        RADIOGRAPHIC STUDIES: I have personally reviewed the radiological images as listed and agreed with the findings in the report.  No results found.    No orders of the defined types were placed in this encounter.  All questions were answered. The patient knows to call the clinic with any problems, questions or concerns. No barriers to learning was detected. The total time spent in the appointment was 25 minutes.     [name], MD 05/02/2023   Carolin Coy, CMA, am acting as scribe for [name], MD.   I have reviewed the above documentation for accuracy and completeness, and I agree with the above.

## 2023-05-02 NOTE — Assessment & Plan Note (Signed)
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 -Diagnosed in March 2019.  Gleason score 5+5 = 10, baseline PSA 27.8 -He had excellent response to treatment, PSA has been undetectable, last checked in December 2023. -Last imaging with CT and bone scan in January 2023 showed no evidence of disease -He is scheduled for PSMA PET scan in June 2024 -Biphosphonate and has been deferred due to no evidence of active bone metastasis. -We reviewed the nature course of metastatic prostate cancer, and treatment options, especially if he progresses on current therapy. -We also discussed the option of discontinue Zytiga (not standard) and continue ADT indefinitely given the excellent disease control.  He is tolerating Zytiga well, he opts to continue current therapy at this point.  -His insurance denied restaging PSMA PET scan in June 2024 -We discussed continue current therapy vs coming off Zytiga.

## 2023-05-06 ENCOUNTER — Ambulatory Visit: Payer: BC Managed Care – PPO

## 2023-05-06 ENCOUNTER — Ambulatory Visit: Payer: BC Managed Care – PPO | Admitting: Hematology

## 2023-05-16 ENCOUNTER — Ambulatory Visit: Payer: BC Managed Care – PPO | Admitting: Neurology

## 2023-05-18 ENCOUNTER — Other Ambulatory Visit: Payer: Self-pay

## 2023-05-19 ENCOUNTER — Other Ambulatory Visit: Payer: Self-pay

## 2023-06-15 ENCOUNTER — Other Ambulatory Visit (HOSPITAL_COMMUNITY): Payer: Self-pay

## 2023-06-17 ENCOUNTER — Other Ambulatory Visit (HOSPITAL_COMMUNITY): Payer: Self-pay

## 2023-06-20 ENCOUNTER — Other Ambulatory Visit: Payer: Self-pay

## 2023-06-29 ENCOUNTER — Telehealth: Payer: Self-pay

## 2023-06-29 ENCOUNTER — Other Ambulatory Visit: Payer: Self-pay

## 2023-06-29 NOTE — Telephone Encounter (Signed)
Pt called stating he was seen in January 2024 by Dr. Mosetta Putt at which time the discussion stopping Zytiga (abiraterone acetate).  At that time pt did not want to stop Zytiga but called today stating he would like to stop and wanted Dr. Mosetta Putt to be aware.  Notified Dr. Mosetta Putt of the pt's decision.

## 2023-07-14 ENCOUNTER — Other Ambulatory Visit (HOSPITAL_COMMUNITY): Payer: Self-pay

## 2023-10-24 ENCOUNTER — Inpatient Hospital Stay: Payer: BC Managed Care – PPO | Attending: Hematology

## 2023-10-24 DIAGNOSIS — Z5111 Encounter for antineoplastic chemotherapy: Secondary | ICD-10-CM | POA: Insufficient documentation

## 2023-10-24 DIAGNOSIS — G893 Neoplasm related pain (acute) (chronic): Secondary | ICD-10-CM | POA: Diagnosis not present

## 2023-10-24 DIAGNOSIS — C7951 Secondary malignant neoplasm of bone: Secondary | ICD-10-CM | POA: Insufficient documentation

## 2023-10-24 DIAGNOSIS — C61 Malignant neoplasm of prostate: Secondary | ICD-10-CM | POA: Diagnosis present

## 2023-10-24 LAB — CBC WITH DIFFERENTIAL (CANCER CENTER ONLY)
Abs Immature Granulocytes: 0.03 10*3/uL (ref 0.00–0.07)
Basophils Absolute: 0.1 10*3/uL (ref 0.0–0.1)
Basophils Relative: 1 %
Eosinophils Absolute: 0.2 10*3/uL (ref 0.0–0.5)
Eosinophils Relative: 3 %
HCT: 43.2 % (ref 39.0–52.0)
Hemoglobin: 14.6 g/dL (ref 13.0–17.0)
Immature Granulocytes: 1 %
Lymphocytes Relative: 26 %
Lymphs Abs: 1.5 10*3/uL (ref 0.7–4.0)
MCH: 30.4 pg (ref 26.0–34.0)
MCHC: 33.8 g/dL (ref 30.0–36.0)
MCV: 89.8 fL (ref 80.0–100.0)
Monocytes Absolute: 0.5 10*3/uL (ref 0.1–1.0)
Monocytes Relative: 9 %
Neutro Abs: 3.3 10*3/uL (ref 1.7–7.7)
Neutrophils Relative %: 60 %
Platelet Count: 182 10*3/uL (ref 150–400)
RBC: 4.81 MIL/uL (ref 4.22–5.81)
RDW: 12 % (ref 11.5–15.5)
WBC Count: 5.5 10*3/uL (ref 4.0–10.5)
nRBC: 0 % (ref 0.0–0.2)

## 2023-10-24 LAB — CMP (CANCER CENTER ONLY)
ALT: 27 U/L (ref 0–44)
AST: 22 U/L (ref 15–41)
Albumin: 4.5 g/dL (ref 3.5–5.0)
Alkaline Phosphatase: 130 U/L — ABNORMAL HIGH (ref 38–126)
Anion gap: 8 (ref 5–15)
BUN: 18 mg/dL (ref 6–20)
CO2: 25 mmol/L (ref 22–32)
Calcium: 9.9 mg/dL (ref 8.9–10.3)
Chloride: 107 mmol/L (ref 98–111)
Creatinine: 0.79 mg/dL (ref 0.61–1.24)
GFR, Estimated: >60 mL/min (ref >60)
Glucose, Bld: 107 mg/dL — ABNORMAL HIGH (ref 70–99)
Potassium: 4 mmol/L (ref 3.5–5.1)
Sodium: 140 mmol/L (ref 135–145)
Total Bilirubin: 0.3 mg/dL (ref <1.2)
Total Protein: 7.1 g/dL (ref 6.5–8.1)

## 2023-10-25 LAB — PROSTATE-SPECIFIC AG, SERUM (LABCORP): Prostate Specific Ag, Serum: <0.1 ng/mL (ref 0.0–4.0)

## 2023-10-30 NOTE — Assessment & Plan Note (Signed)
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 -Diagnosed in March 2019.  Gleason score 5+5 = 10, baseline PSA 27.8 -He had excellent response to treatment, PSA has been undetectable, last checked in December 2023. -Last imaging with CT and bone scan in January 2023 showed no evidence of disease -He is scheduled for PSMA PET scan in June 2024 -Biphosphonate and has been deferred due to no evidence of active bone metastasis. -We reviewed the nature course of metastatic prostate cancer, and treatment options, especially if he progresses on current therapy. -We also discussed the option of discontinue Zytiga (not standard) and continue ADT indefinitely given the excellent disease control.  He is tolerating Zytiga well, he opts to continue current therapy at this point.  -His insurance denied restaging PSMA PET scan in June 2024 -We discussed continue current therapy vs coming off Zytiga.

## 2023-10-31 ENCOUNTER — Encounter: Payer: Self-pay | Admitting: Hematology

## 2023-10-31 ENCOUNTER — Inpatient Hospital Stay (HOSPITAL_BASED_OUTPATIENT_CLINIC_OR_DEPARTMENT_OTHER): Payer: BC Managed Care – PPO | Admitting: Hematology

## 2023-10-31 ENCOUNTER — Inpatient Hospital Stay: Payer: BC Managed Care – PPO

## 2023-10-31 VITALS — BP 137/83 | HR 86 | Temp 97.8°F | Resp 16 | Wt 192.6 lb

## 2023-10-31 DIAGNOSIS — C61 Malignant neoplasm of prostate: Secondary | ICD-10-CM

## 2023-10-31 DIAGNOSIS — Z5111 Encounter for antineoplastic chemotherapy: Secondary | ICD-10-CM | POA: Diagnosis not present

## 2023-10-31 MED ORDER — LEUPROLIDE ACETATE (6 MONTH) 45 MG ~~LOC~~ KIT
45.0000 mg | PACK | Freq: Once | SUBCUTANEOUS | Status: AC
  Administered 2023-10-31: 45 mg via SUBCUTANEOUS
  Filled 2023-10-31: qty 45

## 2023-10-31 NOTE — Progress Notes (Signed)
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 -Diagnosed in March 2019.  Gleason score 5+5 = 10, baseline PSA 27.8 -He had excellent response to treatment, PSA has been undetectable, last checked in December 2023. -Last imaging with CT and bone scan in January 2023 showed no evidence of disease -He is scheduled for PSMA PET scan in June 2024 -Biphosphonate and has been deferred due to no evidence of active bone metastasis. -We reviewed the nature course of metastatic prostate cancer, and treatment options, especially if he progresses on current therapy. -We also discussed the option of discontinue Zytiga (not standard) and continue ADT indefinitely given the excellent disease control.  He is tolerating Zytiga well, he opts to continue current therapy at this point.  -His insurance denied restaging PSMA PET scan in June 2024 -We discussed continue current therapy vs coming off Zytiga, and he decided to come off Zytiga in 05/2023.    Assessment and Plan    Metastatic Prostate Cancer Follow-up for metastatic prostate cancer. Disease well-controlled with undetectable PSA levels. Discontinued Zytiga (abiraterone) in July 2024, reports improved energy. Experiences fatigue post-injection, common with anti-testosterone therapy. Differentiating muscular pain from activities like golf and cancer-related bone pain discussed. Muscular pain is activity-related and improves with rest, while cancer-related pain is persistent. PSA levels will be monitored closely;  Zytiga can be resumed if levels rise. Referral to Dr. Cherly Hensen, prostate cancer specialist, discussed. - Monitor PSA levels every two months - Administer Eligard injection every six months - he knows to report any new or worsening bone pain - Encourage hydration before blood draws - Consider resuming Zytiga if PSA levels rise - Refer to Dr. Cherly Hensen for specialized prostate cancer care - Encourage regular exercise and weight-bearing activities to maintain bone strength  General Health Maintenance Advised to maintain a healthy lifestyle to manage side effects of cancer treatment, including potential weight gain and muscle loss due to slowed metabolism. - Encourage regular exercise, including walking and weight-bearing exercises - Advise a high-protein diet to support muscle maintenance  Plan -will proceed with Eligard injection today and continue every 6 months -continue to hold Zytiga due to very well controlled disease  - Schedule follow-up appointments every two months for lab work - Schedule the next offive visit and Eligard injection in six months -I recommend transferring his care to Dr. 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  The Hereditary Gene Panel offered by [facility] includes sequencing and/or deletion duplication testing of the following 47 genes: APC, ATM, AXIN2, BARD1, BMPR1A, BRCA1, BRCA2, BRIP1, CDH1, CDK4, CDKN2A (p14ARF), CDKN2A (p16INK4a), CHEK2, CTNNA1, DICER1, EPCAM (Deletion/duplication testing only), GREM1 (promoter region deletion/duplication testing only), KIT, MEN1, MLH1, MSH2, MSH3, MSH6, MUTYH, NBN, NF1, NHTL1, PALB2, PDGFRA, PMS2, POLD1, POLE, PTEN, RAD50, RAD51C, RAD51D, SDHB, SDHC, SDHD, SMAD4, SMARCA4.  STK11,  TP53, TSC1, TSC2, and VHL.  The following genes were evaluated for sequence changes only: SDHA and HOXB13 c.251G>A variant only. The report date is Apr 20, 2018.  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  The updated report date is December 17, 2020      Discussed the use of AI scribe software for clinical note transcription with the patient, who gave verbal consent to proceed.  History of Present Illness   The patient, a 52 year old with a history of metastatic prostate cancer, presents for a follow-up visit. He reports feeling better since discontinuing daily medication, Zytiga, about a month ago. He notes an improvement in energy levels, although he anticipates a period of fatigue following his scheduled injection today. The patient's recent blood tests have been satisfactory, with normal CBC, kidney and liver function, and undetectable PSA levels. The patient expresses relief at these results, having been concerned about the potential impact of discontinuing daily medication.  The patient also discusses concerns about distinguishing between muscular pain, potentially related to his golfing activities, and bone pain related to metastasis. 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 The patient is also aware of the potential for weight gain as a side effect of his medication but feels he has managed to maintain a reasonable weight.         All other systems were reviewed with the patient and are negative.  MEDICAL HISTORY:  Past Medical History:  Diagnosis Date   Family history of colon cancer    Prostate CA [facility])    Prostate cancer (HCC) dx'd 02/2018    SURGICAL HISTORY: Past Surgical History:  Procedure Laterality Date   PROSTATE BIOPSY  02/01/2018   TRANSRECTAL ULTRASOUND  02/01/2018    I have reviewed the social history and family history with the patient and they are unchanged from previous note.  ALLERGIES:  has No Known  Allergies.  MEDICATIONS:  Current Outpatient Medications  Medication Sig Dispense Refill   calcium carbonate (OS-CAL) 600 MG TABS tablet TAKE 1 TABLET BY MOUTH TWICE A DAY WITH A MEAL 150 tablet 3   [facility] CALCIUM 600 MG tablet Take 1 tablet by mouth 2 (two) times daily.     lansoprazole (PREVACID) 15 MG capsule Take 15 mg by mouth daily at 12 noon.     valACYclovir (VALTREX) 500 MG tablet Take 500 mg by mouth 2 (two) times daily.     No current facility-administered medications for this visit.    PHYSICAL EXAMINATION: ECOG PERFORMANCE STATUS: 0 - Asymptomatic  Vitals:   10/31/23 1423  BP: 137/83  Pulse: 86  Resp: 16  Temp: 97.8 F (36.6 C)  SpO2: 100%   Wt Readings from Last 3 Encounters:  10/31/23 192 lb 9.6 oz (87.4 kg)  05/02/23 180 lb 4.8 oz (81.8 kg)  03/11/23 182 lb 14.4 oz (83 kg)     GENERAL:alert, no distress and comfortable SKIN: skin color, texture, turgor are normal, no rashes or significant lesions EYES: normal, Conjunctiva are pink and non-injected, sclera clear NECK: supple, thyroid normal size, non-tender, without nodularity LYMPH:  no palpable lymphadenopathy in the cervical, axillary  LUNGS: clear to auscultation and percussion with normal breathing effort HEART: regular rate & rhythm and no murmurs and no lower extremity edema ABDOMEN:abdomen soft, non-tender and normal bowel sounds Musculoskeletal:no cyanosis of digits and no clubbing  NEURO: alert & oriented x 3 with fluent speech, no focal motor/sensory deficits  LABORATORY DATA:  I have reviewed the data as listed    Latest Ref Rng & Units 10/24/2023    9:44 AM 04/26/2023   12:16 PM 03/11/2023    9:21 AM  CBC  WBC 4.0 - 10.5 K/uL 5.5  5.0  4.5   Hemoglobin 13.0 - 17.0 g/dL 09.8  11.9  14.7   Hematocrit 39.0 - 52.0 % 43.2  41.1  44.2   Platelets 150 - 400 K/uL 182  187  205         Latest Ref Rng & Units 10/24/2023    9:44 AM 04/26/2023   12:16 PM 03/11/2023    9:21 AM  CMP  Glucose 70 - 99  mg/dL 829  562  130   BUN 6 - 20 mg/dL 18  14  13    Creatinine 0.61 - 1.24 mg/dL 8.65  7.84  6.96   Sodium 135 - 145 mmol/L 140  142  143   Potassium 3.5 - 5.1 mmol/L 4.0  4.0  3.5   Chloride 98 - 111 mmol/L 107  110  109   CO2 22 - 32 mmol/L 25  26  26    Calcium 8.9 - 10.3 mg/dL 9.9  9.6  29.5   Total Protein 6.5 - 8.1 g/dL 7.1  7.1  7.6   Total Bilirubin <1.2 mg/dL 0.3  0.5  0.6   Alkaline Phos 38 - 126 U/L 130  157  157   AST 15 - 41 U/L 22  16  16    ALT 0 - 44 U/L 27  15  16        RADIOGRAPHIC STUDIES: I have personally reviewed the radiological images as listed and agreed with the findings in the report. No results found.    No orders of the defined types were placed in this encounter.  All questions were answered. The patient knows to call the clinic with any problems, questions or concerns. No barriers to learning was detected. The total time spent in the appointment was 30 minutes.     [name], MD 10/31/2023

## 2024-01-03 ENCOUNTER — Inpatient Hospital Stay: Payer: BC Managed Care – PPO | Attending: Hematology

## 2024-01-03 DIAGNOSIS — C61 Malignant neoplasm of prostate: Secondary | ICD-10-CM | POA: Diagnosis present

## 2024-01-03 LAB — CMP (CANCER CENTER ONLY)
ALT: 28 U/L (ref 0–44)
AST: 22 U/L (ref 15–41)
Albumin: 4.6 g/dL (ref 3.5–5.0)
Alkaline Phosphatase: 141 U/L — ABNORMAL HIGH (ref 38–126)
Anion gap: 6 (ref 5–15)
BUN: 15 mg/dL (ref 6–20)
CO2: 27 mmol/L (ref 22–32)
Calcium: 9.6 mg/dL (ref 8.9–10.3)
Chloride: 107 mmol/L (ref 98–111)
Creatinine: 0.8 mg/dL (ref 0.61–1.24)
GFR, Estimated: >60 mL/min (ref >60)
Glucose, Bld: 102 mg/dL — ABNORMAL HIGH (ref 70–99)
Potassium: 4 mmol/L (ref 3.5–5.1)
Sodium: 140 mmol/L (ref 135–145)
Total Bilirubin: 0.3 mg/dL (ref 0.0–1.2)
Total Protein: 7.3 g/dL (ref 6.5–8.1)

## 2024-01-03 LAB — CBC WITH DIFFERENTIAL (CANCER CENTER ONLY)
Abs Immature Granulocytes: 0.03 10*3/uL (ref 0.00–0.07)
Basophils Absolute: 0.1 10*3/uL (ref 0.0–0.1)
Basophils Relative: 1 %
Eosinophils Absolute: 0.4 10*3/uL (ref 0.0–0.5)
Eosinophils Relative: 6 %
HCT: 42 % (ref 39.0–52.0)
Hemoglobin: 14.5 g/dL (ref 13.0–17.0)
Immature Granulocytes: 1 %
Lymphocytes Relative: 29 %
Lymphs Abs: 1.7 10*3/uL (ref 0.7–4.0)
MCH: 30.5 pg (ref 26.0–34.0)
MCHC: 34.5 g/dL (ref 30.0–36.0)
MCV: 88.2 fL (ref 80.0–100.0)
Monocytes Absolute: 0.5 10*3/uL (ref 0.1–1.0)
Monocytes Relative: 8 %
Neutro Abs: 3.3 10*3/uL (ref 1.7–7.7)
Neutrophils Relative %: 55 %
Platelet Count: 165 10*3/uL (ref 150–400)
RBC: 4.76 MIL/uL (ref 4.22–5.81)
RDW: 12.1 % (ref 11.5–15.5)
WBC Count: 5.9 10*3/uL (ref 4.0–10.5)
nRBC: 0 % (ref 0.0–0.2)

## 2024-01-04 LAB — PROSTATE-SPECIFIC AG, SERUM (LABCORP): Prostate Specific Ag, Serum: <0.1 ng/mL (ref 0.0–4.0)

## 2024-01-09 ENCOUNTER — Other Ambulatory Visit: Payer: Self-pay

## 2024-01-10 ENCOUNTER — Inpatient Hospital Stay: Payer: BC Managed Care – PPO

## 2024-03-13 ENCOUNTER — Other Ambulatory Visit: Payer: Self-pay

## 2024-03-13 ENCOUNTER — Inpatient Hospital Stay: Payer: BC Managed Care – PPO | Attending: Hematology

## 2024-03-13 DIAGNOSIS — C61 Malignant neoplasm of prostate: Secondary | ICD-10-CM

## 2024-03-13 LAB — CBC WITH DIFFERENTIAL (CANCER CENTER ONLY)
Abs Immature Granulocytes: 0.03 10*3/uL (ref 0.00–0.07)
Basophils Absolute: 0.1 10*3/uL (ref 0.0–0.1)
Basophils Relative: 1 %
Eosinophils Absolute: 0.3 10*3/uL (ref 0.0–0.5)
Eosinophils Relative: 4 %
HCT: 40.9 % (ref 39.0–52.0)
Hemoglobin: 14.1 g/dL (ref 13.0–17.0)
Immature Granulocytes: 0 %
Lymphocytes Relative: 33 %
Lymphs Abs: 2.2 10*3/uL (ref 0.7–4.0)
MCH: 30.2 pg (ref 26.0–34.0)
MCHC: 34.5 g/dL (ref 30.0–36.0)
MCV: 87.6 fL (ref 80.0–100.0)
Monocytes Absolute: 0.5 10*3/uL (ref 0.1–1.0)
Monocytes Relative: 7 %
Neutro Abs: 3.7 10*3/uL (ref 1.7–7.7)
Neutrophils Relative %: 55 %
Platelet Count: 187 10*3/uL (ref 150–400)
RBC: 4.67 MIL/uL (ref 4.22–5.81)
RDW: 11.9 % (ref 11.5–15.5)
WBC Count: 6.8 10*3/uL (ref 4.0–10.5)
nRBC: 0 % (ref 0.0–0.2)

## 2024-03-13 LAB — CMP (CANCER CENTER ONLY)
ALT: 22 U/L (ref 0–44)
AST: 19 U/L (ref 15–41)
Albumin: 4.7 g/dL (ref 3.5–5.0)
Alkaline Phosphatase: 136 U/L — ABNORMAL HIGH (ref 38–126)
Anion gap: 6 (ref 5–15)
BUN: 14 mg/dL (ref 6–20)
CO2: 25 mmol/L (ref 22–32)
Calcium: 9.4 mg/dL (ref 8.9–10.3)
Chloride: 110 mmol/L (ref 98–111)
Creatinine: 0.79 mg/dL (ref 0.61–1.24)
GFR, Estimated: >60 mL/min (ref >60)
Glucose, Bld: 94 mg/dL (ref 70–99)
Potassium: 3.9 mmol/L (ref 3.5–5.1)
Sodium: 141 mmol/L (ref 135–145)
Total Bilirubin: 0.4 mg/dL (ref 0.0–1.2)
Total Protein: 7.3 g/dL (ref 6.5–8.1)

## 2024-03-14 ENCOUNTER — Encounter: Payer: Self-pay | Admitting: Nurse Practitioner

## 2024-03-14 LAB — PROSTATE-SPECIFIC AG, SERUM (LABCORP): Prostate Specific Ag, Serum: <0.1 ng/mL (ref 0.0–4.0)

## 2024-04-24 ENCOUNTER — Inpatient Hospital Stay: Payer: BC Managed Care – PPO | Attending: Hematology

## 2024-04-24 DIAGNOSIS — C61 Malignant neoplasm of prostate: Secondary | ICD-10-CM | POA: Diagnosis present

## 2024-04-24 DIAGNOSIS — C7951 Secondary malignant neoplasm of bone: Secondary | ICD-10-CM | POA: Insufficient documentation

## 2024-04-24 DIAGNOSIS — Z5111 Encounter for antineoplastic chemotherapy: Secondary | ICD-10-CM | POA: Diagnosis present

## 2024-04-24 LAB — CBC WITH DIFFERENTIAL (CANCER CENTER ONLY)
Abs Immature Granulocytes: 0.02 10*3/uL (ref 0.00–0.07)
Basophils Absolute: 0 10*3/uL (ref 0.0–0.1)
Basophils Relative: 1 %
Eosinophils Absolute: 0.2 10*3/uL (ref 0.0–0.5)
Eosinophils Relative: 4 %
HCT: 41.3 % (ref 39.0–52.0)
Hemoglobin: 14.2 g/dL (ref 13.0–17.0)
Immature Granulocytes: 0 %
Lymphocytes Relative: 32 %
Lymphs Abs: 1.8 10*3/uL (ref 0.7–4.0)
MCH: 30.3 pg (ref 26.0–34.0)
MCHC: 34.4 g/dL (ref 30.0–36.0)
MCV: 88.2 fL (ref 80.0–100.0)
Monocytes Absolute: 0.4 10*3/uL (ref 0.1–1.0)
Monocytes Relative: 8 %
Neutro Abs: 3 10*3/uL (ref 1.7–7.7)
Neutrophils Relative %: 55 %
Platelet Count: 181 10*3/uL (ref 150–400)
RBC: 4.68 MIL/uL (ref 4.22–5.81)
RDW: 12 % (ref 11.5–15.5)
WBC Count: 5.5 10*3/uL (ref 4.0–10.5)
nRBC: 0 % (ref 0.0–0.2)

## 2024-04-24 LAB — CMP (CANCER CENTER ONLY)
ALT: 27 U/L (ref 0–44)
AST: 22 U/L (ref 15–41)
Albumin: 4.6 g/dL (ref 3.5–5.0)
Alkaline Phosphatase: 130 U/L — ABNORMAL HIGH (ref 38–126)
Anion gap: 7 (ref 5–15)
BUN: 14 mg/dL (ref 6–20)
CO2: 26 mmol/L (ref 22–32)
Calcium: 9.4 mg/dL (ref 8.9–10.3)
Chloride: 109 mmol/L (ref 98–111)
Creatinine: 0.79 mg/dL (ref 0.61–1.24)
GFR, Estimated: >60 mL/min (ref >60)
Glucose, Bld: 107 mg/dL — ABNORMAL HIGH (ref 70–99)
Potassium: 3.9 mmol/L (ref 3.5–5.1)
Sodium: 142 mmol/L (ref 135–145)
Total Bilirubin: 0.5 mg/dL (ref 0.0–1.2)
Total Protein: 7.4 g/dL (ref 6.5–8.1)

## 2024-04-25 LAB — PROSTATE-SPECIFIC AG, SERUM (LABCORP): Prostate Specific Ag, Serum: <0.1 ng/mL (ref 0.0–4.0)

## 2024-04-29 NOTE — Assessment & Plan Note (Signed)
 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 -Diagnosed in March 2019.  Gleason score 5+5 = 10, baseline PSA 27.8 -He had excellent response to treatment, PSA has been undetectable, last checked in December 2023. -Last imaging with CT and bone scan in January 2023 showed no evidence of disease -He is scheduled for PSMA PET scan in June 2024 -Biphosphonate and has been deferred due to no evidence of active bone metastasis. -We reviewed the nature course of metastatic prostate cancer, and treatment options, especially if he progresses on current therapy. -We also discussed the option of discontinue Zytiga  (not standard) and continue ADT indefinitely given the excellent disease control.  He is tolerating Zytiga  well, he opts to continue current therapy at this point.  -His insurance denied restaging PSMA PET scan in June 2024 -We discussed continue current therapy vs coming off Zytiga , and he decided to come off Zytiga  in 05/2023.  -04/30/2024 -PSA continues to be <0.1.  Continues off Zytiga .  Monitor PSA level every 2 months.  Will restart Zytiga  if PSA level starts to increase.  Proceed with Eligard  injection.  Continue Eligard  injections every 6 months.

## 2024-04-29 NOTE — Progress Notes (Unsigned)
 Patient Care Team: Ronna Coho, MD as PCP - General (Family Medicine) Sonja Jim Thorpe, MD as Attending Physician (Hematology and Oncology)  Clinic Day:  04/30/2024  Referring physician: Ronna Coho, MD  ASSESSMENT & PLAN:   Assessment & Plan: Malignant neoplasm of prostate [facility])  stage IV with lymphadenopathy and limited to bone metastasis, castration sensitive. -Diagnosed in March 2019.  Gleason score 5+5 = 10, baseline PSA 27.8 -He had excellent response to treatment, PSA has been undetectable, last checked in December 2023. -Last imaging with CT and bone scan in January 2023 showed no evidence of disease -He is scheduled for PSMA PET scan in June 2024 -Biphosphonate and has been deferred due to no evidence of active bone metastasis. -We reviewed the nature course of metastatic prostate cancer, and treatment options, especially if he progresses on current therapy. -We also discussed the option of discontinue Zytiga  (not standard) and continue ADT indefinitely given the excellent disease control.  He is tolerating Zytiga  well, he opts to continue current therapy at this point.  -His insurance denied restaging PSMA PET scan in June 2024 -We discussed continue current therapy vs coming off Zytiga , and he decided to come off Zytiga  in 05/2023.  -04/30/2024 -PSA continues to be <0.1.  Continues off Zytiga .  Monitor PSA level every 2 months.  Will restart Zytiga  if PSA level starts to increase.  Proceed with Eligard  injection.  Continue Eligard  injections every 6 months.   Sleeping difficulty  Patient states he has had difficulty sleeping since initial diagnosis with prostate cancer.  He states it is difficult to fall asleep and stay sleep.  Also having hot flashes and night sweats which interfere with his sleep.  Trial trazodone 50 mg at bedtime as needed.  Encouraged him to start with 1/2 tablet at bedtime.  If not effective, may increase to 1 tablet at bedtime.  Recommend he discuss further with  his primary care.  He may also want to discuss treatment with something like Effexor to help with hot flashes and night sweats with his primary care.  Plan: Labs reviewed. -CBC and CMP unremarkable - PSA <0.1. Continue to monitor PSA level every 2 months. -Plan to restart Zytiga  if PSA levels begin to rise.  Consider additional scans (CT CAP and bone survey) if PSA level rises. Proceed with Eligard  injection today. Follow-up and Eligard  injection in 6 months.  Check labs with PSA 1 week prior to follow-up appointment. Follow-up sooner than 6 months for new or concerning symptoms.   The patient understands the plans discussed today and is in agreement with them.  He knows to contact our office if he develops concerns prior to his next appointment.  I provided 25 minutes of face-to-face time during this encounter and > 50% was spent counseling as documented under my assessment and plan.    Sharyon Deis, NP  [facility] - A DEPT OF [facility] [address] [place] [address] Dept: [phone] Dept Fax: [phone]   No orders of the defined types were placed in this encounter.     CHIEF COMPLAINT:  CC: Malignant neoplasm of prostate  Current Treatment: Eligard  every 6 months  INTERVAL HISTORY:  Frank [name] is here today for repeat clinical assessment.  He was last seen by Dr. Maryalice Smaller on 10/31/2023.  Labs done on 04/24/2024 were unremarkable.  PSA was <0.1.  States his biggest concern has been difficulty sleeping.  Has been a  problem since starting treatment for prostate cancer.  States that hot flashes and night sweats interfere with his ability to fall and stay asleep.  He denies problems with urination.  Denies frequency, urgency, or hesitation in relation to urination.  No longer seeing urology.  Does have joint pain in both shoulders and both hips.  Feels like this is related to arthritic issues.  Plays golf,  sometimes 36 holes over the weekend.  Does not think any joint pain he has after that is related to prostate cancer.  He denies chest pain, chest pressure, or shortness of breath. He denies headaches or visual disturbances. He denies abdominal pain, nausea, vomiting, or changes in bowel or bladder habits.  He denies fevers or chills. He denies pain. His appetite is good. His weight has decreased 4 pounds over last 6 months.  I have reviewed the past medical history, past surgical history, social history and family history with the patient and they are unchanged from previous note.  ALLERGIES:  has no known allergies.  MEDICATIONS:  Current Outpatient Medications  Medication Sig Dispense Refill   traZODone (DESYREL) 50 MG tablet Take 0.5-1 tablets (25-50 mg total) by mouth at bedtime. 30 tablet 1   calcium  carbonate (OS-CAL) 600 MG TABS tablet TAKE 1 TABLET BY MOUTH TWICE A DAY WITH A MEAL 150 tablet 3   [facility] CALCIUM  600 MG tablet Take 1 tablet by mouth 2 (two) times daily.     lansoprazole (PREVACID) 15 MG capsule Take 15 mg by mouth daily at 12 noon.     valACYclovir  (VALTREX ) 500 MG tablet Take 500 mg by mouth 2 (two) times daily.     No current facility-administered medications for this visit.    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  The Hereditary Gene Panel offered by [facility] includes sequencing and/or deletion duplication testing of the following 47 genes: APC, ATM, AXIN2, BARD1, BMPR1A, BRCA1, BRCA2, BRIP1, CDH1, CDK4, CDKN2A (p14ARF), CDKN2A (p16INK4a), CHEK2, CTNNA1, DICER1, EPCAM (Deletion/duplication testing only), GREM1 (promoter region deletion/duplication testing only), KIT, MEN1, MLH1, MSH2, MSH3, MSH6, MUTYH, NBN, NF1, NHTL1, PALB2, PDGFRA, PMS2, POLD1,  POLE, PTEN, RAD50, RAD51C, RAD51D, SDHB, SDHC, SDHD, SMAD4, SMARCA4. STK11, TP53, TSC1, TSC2, and VHL.  The following genes were evaluated for sequence changes only: SDHA and HOXB13 c.251G>A variant only. The report date is Apr 20, 2018.  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  The updated report date is December 17, 2020       REVIEW OF SYSTEMS:   Constitutional: Denies fevers, chills or abnormal weight loss Eyes: Denies blurriness of vision Ears, nose, mouth, throat, and face: Denies mucositis or sore throat Respiratory: Denies cough, dyspnea or wheezes Cardiovascular: Denies palpitation, chest discomfort or lower extremity swelling Gastrointestinal:  Denies nausea, heartburn or change in bowel habits Skin: Denies abnormal skin rashes Lymphatics: Denies new lymphadenopathy or easy bruising Neurological:Denies numbness, tingling or new weaknesses Behavioral/Psych: Mood is stable, no new changes. Difficulty sleeping  All other systems were reviewed with the patient and are negative.   VITALS:   Today's Vitals   04/30/24 1406 04/30/24 1514  BP: 138/88   Pulse: 73   Resp: 17   Temp: 98.4 F (36.9 C)   SpO2: 99%   Weight: 188 lb  9.6 oz (85.5 kg)   PainSc:  0-No pain   Body mass index is 27.85 kg/m.   Wt Readings from Last 3 Encounters:  04/30/24 188 lb 9.6 oz (85.5 kg)  10/31/23 192 lb 9.6 oz (87.4 kg)  05/02/23 180 lb 4.8 oz (81.8 kg)    Body mass index is 27.85 kg/m.  Performance status (ECOG): 0 - Asymptomatic  PHYSICAL EXAM:   GENERAL:alert, no distress and comfortable SKIN: skin color, texture, turgor are normal, no rashes or significant lesions EYES: normal, Conjunctiva are pink and non-injected, sclera clear OROPHARYNX:no exudate, no erythema and lips, buccal mucosa, and tongue normal  NECK: supple, thyroid normal size, non-tender, without nodularity LYMPH:  no palpable lymphadenopathy in the cervical, axillary or  inguinal LUNGS: clear to auscultation and percussion with normal breathing effort HEART: regular rate & rhythm and no murmurs and no lower extremity edema ABDOMEN:abdomen soft, non-tender and normal bowel sounds Musculoskeletal:no cyanosis of digits and no clubbing  NEURO: alert & oriented x 3 with fluent speech, no focal motor/sensory deficits  LABORATORY DATA:  I have reviewed the data as listed    Component Value Date/Time   NA 142 04/24/2024 1356   K 3.9 04/24/2024 1356   CL 109 04/24/2024 1356   CO2 26 04/24/2024 1356   GLUCOSE 107 (H) 04/24/2024 1356   BUN 14 04/24/2024 1356   CREATININE 0.79 04/24/2024 1356   CALCIUM  9.4 04/24/2024 1356   PROT 7.4 04/24/2024 1356   ALBUMIN 4.6 04/24/2024 1356   AST 22 04/24/2024 1356   ALT 27 04/24/2024 1356   ALKPHOS 130 (H) 04/24/2024 1356   BILITOT 0.5 04/24/2024 1356   GFRNONAA >60 04/24/2024 1356   GFRAA >60 07/25/2020 0921     Lab Results  Component Value Date   WBC 5.5 04/24/2024   NEUTROABS 3.0 04/24/2024   HGB 14.2 04/24/2024   HCT 41.3 04/24/2024   MCV 88.2 04/24/2024   PLT 181 04/24/2024    Lab Results  Component Value Date   PSA1 <0.1 04/24/2024   PSA1 <0.1 03/13/2024   PSA1 <0.1 01/03/2024

## 2024-04-30 ENCOUNTER — Encounter: Payer: Self-pay | Admitting: Nurse Practitioner

## 2024-04-30 ENCOUNTER — Inpatient Hospital Stay (HOSPITAL_BASED_OUTPATIENT_CLINIC_OR_DEPARTMENT_OTHER): Payer: BC Managed Care – PPO | Admitting: Nurse Practitioner

## 2024-04-30 ENCOUNTER — Inpatient Hospital Stay: Payer: BC Managed Care – PPO

## 2024-04-30 VITALS — BP 138/88 | HR 73 | Temp 98.4°F | Resp 17 | Wt 188.6 lb

## 2024-04-30 DIAGNOSIS — C61 Malignant neoplasm of prostate: Secondary | ICD-10-CM

## 2024-04-30 DIAGNOSIS — Z5111 Encounter for antineoplastic chemotherapy: Secondary | ICD-10-CM | POA: Diagnosis not present

## 2024-04-30 MED ORDER — TRAZODONE HCL 50 MG PO TABS: 25.0000 mg | ORAL_TABLET | Freq: Every day | ORAL | 1 refills | Status: DC

## 2024-04-30 MED ORDER — LEUPROLIDE ACETATE (6 MONTH) 45 MG ~~LOC~~ KIT
45.0000 mg | PACK | Freq: Once | SUBCUTANEOUS | Status: AC
  Administered 2024-04-30: 45 mg via SUBCUTANEOUS
  Filled 2024-04-30: qty 45

## 2024-06-24 ENCOUNTER — Other Ambulatory Visit: Payer: Self-pay | Admitting: Nurse Practitioner

## 2024-06-25 ENCOUNTER — Encounter: Payer: Self-pay | Admitting: Hematology

## 2024-07-02 ENCOUNTER — Inpatient Hospital Stay: Attending: Hematology

## 2024-07-02 DIAGNOSIS — C61 Malignant neoplasm of prostate: Secondary | ICD-10-CM | POA: Diagnosis present

## 2024-07-02 LAB — CBC WITH DIFFERENTIAL (CANCER CENTER ONLY)
Abs Immature Granulocytes: 0.02 K/uL (ref 0.00–0.07)
Basophils Absolute: 0 K/uL (ref 0.0–0.1)
Basophils Relative: 1 %
Eosinophils Absolute: 0.3 K/uL (ref 0.0–0.5)
Eosinophils Relative: 5 %
HCT: 39.9 % (ref 39.0–52.0)
Hemoglobin: 13.7 g/dL (ref 13.0–17.0)
Immature Granulocytes: 0 %
Lymphocytes Relative: 30 %
Lymphs Abs: 1.6 K/uL (ref 0.7–4.0)
MCH: 30.5 pg (ref 26.0–34.0)
MCHC: 34.3 g/dL (ref 30.0–36.0)
MCV: 88.9 fL (ref 80.0–100.0)
Monocytes Absolute: 0.4 K/uL (ref 0.1–1.0)
Monocytes Relative: 7 %
Neutro Abs: 3 K/uL (ref 1.7–7.7)
Neutrophils Relative %: 57 %
Platelet Count: 161 K/uL (ref 150–400)
RBC: 4.49 MIL/uL (ref 4.22–5.81)
RDW: 12.3 % (ref 11.5–15.5)
WBC Count: 5.2 K/uL (ref 4.0–10.5)
nRBC: 0 % (ref 0.0–0.2)

## 2024-07-02 LAB — CMP (CANCER CENTER ONLY)
ALT: 18 U/L (ref 0–44)
AST: 18 U/L (ref 15–41)
Albumin: 4.5 g/dL (ref 3.5–5.0)
Alkaline Phosphatase: 123 U/L (ref 38–126)
Anion gap: 5 (ref 5–15)
BUN: 13 mg/dL (ref 6–20)
CO2: 27 mmol/L (ref 22–32)
Calcium: 9.3 mg/dL (ref 8.9–10.3)
Chloride: 110 mmol/L (ref 98–111)
Creatinine: 0.83 mg/dL (ref 0.61–1.24)
GFR, Estimated: >60 mL/min (ref >60)
Glucose, Bld: 96 mg/dL (ref 70–99)
Potassium: 3.7 mmol/L (ref 3.5–5.1)
Sodium: 142 mmol/L (ref 135–145)
Total Bilirubin: 0.4 mg/dL (ref 0.0–1.2)
Total Protein: 7 g/dL (ref 6.5–8.1)

## 2024-07-03 LAB — PROSTATE-SPECIFIC AG, SERUM (LABCORP): Prostate Specific Ag, Serum: <0.1 ng/mL (ref 0.0–4.0)

## 2024-08-15 ENCOUNTER — Telehealth: Payer: Self-pay | Admitting: Nurse Practitioner

## 2024-08-15 NOTE — Telephone Encounter (Signed)
 Rescheduled patients appointments on 12/15. Called and left voicemail with new day and times.

## 2024-09-03 ENCOUNTER — Inpatient Hospital Stay: Attending: Hematology

## 2024-09-03 DIAGNOSIS — C7951 Secondary malignant neoplasm of bone: Secondary | ICD-10-CM | POA: Insufficient documentation

## 2024-09-03 DIAGNOSIS — C61 Malignant neoplasm of prostate: Secondary | ICD-10-CM | POA: Insufficient documentation

## 2024-09-03 LAB — CBC WITH DIFFERENTIAL (CANCER CENTER ONLY)
Abs Immature Granulocytes: 0.02 K/uL (ref 0.00–0.07)
Basophils Absolute: 0.1 K/uL (ref 0.0–0.1)
Basophils Relative: 1 %
Eosinophils Absolute: 0.2 K/uL (ref 0.0–0.5)
Eosinophils Relative: 3 %
HCT: 42.2 % (ref 39.0–52.0)
Hemoglobin: 14.3 g/dL (ref 13.0–17.0)
Immature Granulocytes: 0 %
Lymphocytes Relative: 31 %
Lymphs Abs: 1.7 K/uL (ref 0.7–4.0)
MCH: 30.3 pg (ref 26.0–34.0)
MCHC: 33.9 g/dL (ref 30.0–36.0)
MCV: 89.4 fL (ref 80.0–100.0)
Monocytes Absolute: 0.4 K/uL (ref 0.1–1.0)
Monocytes Relative: 7 %
Neutro Abs: 3.1 K/uL (ref 1.7–7.7)
Neutrophils Relative %: 58 %
Platelet Count: 179 K/uL (ref 150–400)
RBC: 4.72 MIL/uL (ref 4.22–5.81)
RDW: 11.9 % (ref 11.5–15.5)
WBC Count: 5.4 K/uL (ref 4.0–10.5)
nRBC: 0 % (ref 0.0–0.2)

## 2024-09-03 LAB — CMP (CANCER CENTER ONLY)
ALT: 19 U/L (ref 0–44)
AST: 18 U/L (ref 15–41)
Albumin: 4.6 g/dL (ref 3.5–5.0)
Alkaline Phosphatase: 139 U/L — ABNORMAL HIGH (ref 38–126)
Anion gap: 7 (ref 5–15)
BUN: 13 mg/dL (ref 6–20)
CO2: 27 mmol/L (ref 22–32)
Calcium: 9.9 mg/dL (ref 8.9–10.3)
Chloride: 108 mmol/L (ref 98–111)
Creatinine: 0.76 mg/dL (ref 0.61–1.24)
GFR, Estimated: >60 mL/min (ref >60)
Glucose, Bld: 111 mg/dL — ABNORMAL HIGH (ref 70–99)
Potassium: 3.9 mmol/L (ref 3.5–5.1)
Sodium: 142 mmol/L (ref 135–145)
Total Bilirubin: 0.5 mg/dL (ref 0.0–1.2)
Total Protein: 7.1 g/dL (ref 6.5–8.1)

## 2024-09-03 LAB — PSA: Prostatic Specific Antigen: <0.02 ng/mL (ref 0.00–4.00)

## 2024-09-04 ENCOUNTER — Other Ambulatory Visit: Payer: Self-pay

## 2024-10-20 ENCOUNTER — Other Ambulatory Visit: Payer: Self-pay | Admitting: Nurse Practitioner

## 2024-10-22 ENCOUNTER — Encounter: Payer: Self-pay | Admitting: Hematology

## 2024-10-29 ENCOUNTER — Other Ambulatory Visit: Payer: Self-pay

## 2024-10-29 ENCOUNTER — Inpatient Hospital Stay: Attending: Hematology

## 2024-10-29 DIAGNOSIS — Z5111 Encounter for antineoplastic chemotherapy: Secondary | ICD-10-CM | POA: Insufficient documentation

## 2024-10-29 DIAGNOSIS — C7951 Secondary malignant neoplasm of bone: Secondary | ICD-10-CM | POA: Insufficient documentation

## 2024-10-29 DIAGNOSIS — C61 Malignant neoplasm of prostate: Secondary | ICD-10-CM | POA: Insufficient documentation

## 2024-10-29 LAB — CBC WITH DIFFERENTIAL (CANCER CENTER ONLY)
Abs Immature Granulocytes: 0.03 K/uL (ref 0.00–0.07)
Basophils Absolute: 0.1 K/uL (ref 0.0–0.1)
Basophils Relative: 1 %
Eosinophils Absolute: 0.2 K/uL (ref 0.0–0.5)
Eosinophils Relative: 3 %
HCT: 41.9 % (ref 39.0–52.0)
Hemoglobin: 14.3 g/dL (ref 13.0–17.0)
Immature Granulocytes: 1 %
Lymphocytes Relative: 32 %
Lymphs Abs: 1.9 K/uL (ref 0.7–4.0)
MCH: 30.4 pg (ref 26.0–34.0)
MCHC: 34.1 g/dL (ref 30.0–36.0)
MCV: 89.1 fL (ref 80.0–100.0)
Monocytes Absolute: 0.4 K/uL (ref 0.1–1.0)
Monocytes Relative: 6 %
Neutro Abs: 3.5 K/uL (ref 1.7–7.7)
Neutrophils Relative %: 57 %
Platelet Count: 179 K/uL (ref 150–400)
RBC: 4.7 MIL/uL (ref 4.22–5.81)
RDW: 12 % (ref 11.5–15.5)
WBC Count: 6.1 K/uL (ref 4.0–10.5)
nRBC: 0 % (ref 0.0–0.2)

## 2024-10-29 LAB — CMP (CANCER CENTER ONLY)
ALT: 25 U/L (ref 0–44)
AST: 26 U/L (ref 15–41)
Albumin: 4.8 g/dL (ref 3.5–5.0)
Alkaline Phosphatase: 149 U/L — ABNORMAL HIGH (ref 38–126)
Anion gap: 12 (ref 5–15)
BUN: 12 mg/dL (ref 6–20)
CO2: 25 mmol/L (ref 22–32)
Calcium: 9.6 mg/dL (ref 8.9–10.3)
Chloride: 106 mmol/L (ref 98–111)
Creatinine: 0.75 mg/dL (ref 0.61–1.24)
GFR, Estimated: >60 mL/min (ref >60)
Glucose, Bld: 103 mg/dL — ABNORMAL HIGH (ref 70–99)
Potassium: 3.9 mmol/L (ref 3.5–5.1)
Sodium: 143 mmol/L (ref 135–145)
Total Bilirubin: 0.4 mg/dL (ref 0.0–1.2)
Total Protein: 7.5 g/dL (ref 6.5–8.1)

## 2024-10-29 LAB — PSA: Prostatic Specific Antigen: <0.02 ng/mL (ref 0.00–4.00)

## 2024-11-05 ENCOUNTER — Inpatient Hospital Stay

## 2024-11-05 ENCOUNTER — Inpatient Hospital Stay: Admitting: Nurse Practitioner

## 2024-11-05 NOTE — Assessment & Plan Note (Addendum)
"   stage IV with lymphadenopathy and limited to bone metastasis, castration sensitive. -Diagnosed in March 2019.  Gleason score 5+5 = 10, baseline PSA 27.8 -He had excellent response to treatment, PSA has been undetectable, last checked in December 2023. -Last imaging with CT and bone scan in January 2023 showed no evidence of disease -He is scheduled for PSMA PET scan in June 2024 -Biphosphonate and has been deferred due to no evidence of active bone metastasis. -We reviewed the nature course of metastatic prostate cancer, and treatment options, especially if he progresses on current therapy. -We also discussed the option of discontinue Zytiga  (not standard) and continue ADT indefinitely given the excellent disease control.  He is tolerating Zytiga  well, he opts to continue current therapy at this point.  -His insurance denied restaging PSMA PET scan in June 2024 -We discussed continue current therapy vs coming off Zytiga , and he decided to come off Zytiga  in 05/2023.  -04/30/2024 -PSA continues to be <0.1.  Continues off Zytiga .  Monitor PSA level every 2 months.  Will restart Zytiga  if PSA level starts to increase.  Proceed with Eligard  injection.  Continue Eligard  injections every 6 months. - 11/08/2024 - PSA remains <0.02.  He is on Eligard  injections every 6 months.   "

## 2024-11-05 NOTE — Progress Notes (Addendum)
 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 -Diagnosed in March 2019.  Gleason score 5+5 = 10, baseline PSA 27.8 -He had excellent response to treatment, PSA has been undetectable, last checked in December 2023. -Last imaging with CT and bone scan in January 2023 showed no evidence of disease -He is scheduled for PSMA PET scan in June 2024 -Biphosphonate and has been deferred due to no evidence of active bone metastasis. -We reviewed the nature course of metastatic prostate cancer, and treatment options, especially if he progresses on current therapy. -We also discussed the option of discontinue Zytiga  (not standard) and continue ADT indefinitely given the excellent disease control.  He is tolerating Zytiga  well, he opts to continue current therapy at this point.  -His insurance denied restaging PSMA PET scan in June 2024 -We discussed continue current therapy vs coming off Zytiga , and he decided to come off Zytiga  in 05/2023.  -04/30/2024 -PSA continues to be <0.1.  Continues off Zytiga .  Monitor PSA level every 2 months.  Will restart Zytiga  if PSA level starts to increase.  Proceed with Eligard  injection.  Continue Eligard  injections every 6 months. - 11/08/2024 - PSA remains <0.02.  He is on Eligard  injections every 6 months.    Plan Labs reviewed. -Unremarkable CBC and CMP. -PSA <0.02. Proceed with Eligard  injection today. Continue with PSA checks every 2 Continue Eligard  injections every 6 months.   The patient understands the plans discussed today and is in agreement with them.  He knows to contact our office if he develops concerns prior to his next  appointment.  I provided 20 minutes of face-to-face time during this encounter and > 50% was spent counseling as documented under my assessment and plan.    Powell FORBES Lessen, NP  [facility] - A DEPT [facility] [address] [place] [address] Dept: [phone] Dept Fax: [phone]   Orders Placed This Encounter  Procedures   PSA, total and free    Standing Status:   Standing    Number of Occurrences:   20    Expiration Date:   11/18/2025      CHIEF COMPLAINT:  CC: Malignant neoplasm of prostate  Current Treatment: Eligard  every 6 months  INTERVAL HISTORY:  Frank [name] is here today for repeat clinical assessment.  He last saw me on 04/30/2024.  He has CBC, CMP, and PSA done on 10/29/2024.  His labs were unremarkable with PSA <0.02.  He reports manageable hot flashes and night  sweats. Has no new side effects to  report.  He denies chest pain, chest pressure, or shortness of breath. He denies headaches or visual disturbances. He denies abdominal pain, nausea, vomiting, or changes in bowel or bladder habits.  He denies fevers or chills. He denies pain. His appetite is good. His weight has been stable.  I have reviewed the past medical history, past surgical history, social history and family history with the patient and they are unchanged from previous note.  ALLERGIES:  has no known allergies.  MEDICATIONS:  Current Outpatient Medications  Medication Sig Dispense Refill   calcium  carbonate (OS-CAL) 600 MG TABS  tablet TAKE 1 TABLET BY MOUTH TWICE A DAY WITH A MEAL 150 tablet 3   [facility] CALCIUM  600 MG tablet Take 1 tablet by mouth 2 (two) times daily.     lansoprazole (PREVACID) 15 MG capsule Take 15 mg by mouth daily at 12 noon.     traZODone  (DESYREL ) 50 MG tablet TAKE 1/2 TO 1 TABLET (25 TO 50 MG TOTAL) BY MOUTH AT BEDTIME 30 tablet 0   valACYclovir (VALTREX) 500 MG tablet Take 500 mg by mouth 2 (two) times  daily.     No current facility-administered medications for this visit.    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  The Hereditary Gene Panel offered by [facility] includes sequencing and/or deletion duplication testing of the following 47 genes: APC, ATM, AXIN2, BARD1, BMPR1A, BRCA1, BRCA2, BRIP1, CDH1, CDK4, CDKN2A (p14ARF), CDKN2A (p16INK4a), CHEK2, CTNNA1, DICER1, EPCAM (Deletion/duplication testing only), GREM1 (promoter region deletion/duplication testing only), KIT, MEN1, MLH1, MSH2, MSH3, MSH6, MUTYH, NBN, NF1, NHTL1, PALB2, PDGFRA, PMS2, POLD1, POLE, PTEN, RAD50, RAD51C, RAD51D, SDHB, SDHC, SDHD, SMAD4, SMARCA4. STK11, TP53, TSC1, TSC2, and VHL.  The following genes were evaluated for sequence changes only: SDHA and HOXB13 c.251G>A variant only. The report date is Apr 20, 2018.  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  The updated report date is December 17, 2020       REVIEW OF SYSTEMS:   Constitutional: Denies fevers, chills or abnormal weight loss Eyes: Denies blurriness of vision Ears, nose, mouth, throat, and face: Denies mucositis or sore throat Respiratory: Denies cough, dyspnea or wheezes Cardiovascular: Denies palpitation, chest discomfort or lower extremity swelling Gastrointestinal:  Denies nausea, heartburn or change in bowel habits Skin: Denies abnormal skin rashes Lymphatics: Denies new lymphadenopathy or easy bruising Neurological:Denies numbness, tingling or new weaknesses Behavioral/Psych: Mood is stable, no new changes  All other systems were reviewed with the patient and are negative.   VITALS:   Today's Vitals   11/08/24 1143 11/08/24 1204  BP: 134/80   Pulse: 74   Resp: 17   Temp: 97.8 F  (36.6 C)   SpO2: 97%   Weight: 189 lb 1.6 oz (85.8 kg)   PainSc:  0-No pain   Body mass index is 27.93 kg/m.   Wt Readings from Last 3 Encounters:  11/08/24 189 lb 1.6 oz (85.8 kg)  04/30/24 188 lb 9.6 oz (85.5 kg)  10/31/23 192 lb 9.6 oz (87.4 kg)    Body mass index is 27.93 kg/m.  Performance status (ECOG): 1 - Symptomatic but completely ambulatory  PHYSICAL EXAM:   GENERAL:alert, no distress and comfortable SKIN: skin color, texture, turgor are normal, no rashes or significant lesions EYES: normal, Conjunctiva are pink and non-injected, sclera clear OROPHARYNX:no exudate, no erythema and lips, buccal mucosa, and tongue normal  NECK: supple, thyroid normal size, non-tender, without nodularity LYMPH:  no palpable lymphadenopathy in the cervical, axillary or inguinal LUNGS: clear to auscultation and percussion with normal breathing effort HEART: regular rate & rhythm and no murmurs and no lower extremity edema ABDOMEN:abdomen soft, non-tender and normal bowel sounds Musculoskeletal:no cyanosis of digits and no clubbing  NEURO: alert & oriented x 3 with fluent speech, no focal motor/sensory deficits  LABORATORY DATA:  I have reviewed the data as listed  Component Value Date/Time   NA 143 10/29/2024 1410   K 3.9 10/29/2024 1410   CL 106 10/29/2024 1410   CO2 25 10/29/2024 1410   GLUCOSE 103 (H) 10/29/2024 1410   BUN 12 10/29/2024 1410   CREATININE 0.75 10/29/2024 1410   CALCIUM  9.6 10/29/2024 1410   PROT 7.5 10/29/2024 1410   ALBUMIN 4.8 10/29/2024 1410   AST 26 10/29/2024 1410   ALT 25 10/29/2024 1410   ALKPHOS 149 (H) 10/29/2024 1410   BILITOT 0.4 10/29/2024 1410   GFRNONAA >60 10/29/2024 1410   GFRAA >60 07/25/2020 0921   Lab Results  Component Value Date   WBC 6.1 10/29/2024   NEUTROABS 3.5 10/29/2024   HGB 14.3 10/29/2024   HCT 41.9 10/29/2024   MCV 89.1 10/29/2024   PLT 179 10/29/2024    "

## 2024-11-08 ENCOUNTER — Inpatient Hospital Stay

## 2024-11-08 ENCOUNTER — Inpatient Hospital Stay: Admitting: Nurse Practitioner

## 2024-11-08 VITALS — BP 134/80 | HR 74 | Temp 97.8°F | Resp 17 | Wt 189.1 lb

## 2024-11-08 DIAGNOSIS — C61 Malignant neoplasm of prostate: Secondary | ICD-10-CM | POA: Diagnosis not present

## 2024-11-08 DIAGNOSIS — Z5111 Encounter for antineoplastic chemotherapy: Secondary | ICD-10-CM | POA: Diagnosis not present

## 2024-11-08 MED ORDER — LEUPROLIDE ACETATE (6 MONTH) 45 MG ~~LOC~~ KIT
45.0000 mg | PACK | Freq: Once | SUBCUTANEOUS | Status: AC
  Administered 2024-11-08: 13:00:00 45 mg via SUBCUTANEOUS
  Filled 2024-11-08: qty 45

## 2024-11-18 ENCOUNTER — Encounter: Payer: Self-pay | Admitting: Nurse Practitioner

## 2024-11-18 ENCOUNTER — Encounter: Payer: Self-pay | Admitting: Hematology

## 2024-11-21 ENCOUNTER — Other Ambulatory Visit: Payer: Self-pay | Admitting: Nurse Practitioner

## 2025-01-09 ENCOUNTER — Inpatient Hospital Stay

## 2025-03-11 ENCOUNTER — Inpatient Hospital Stay

## 2025-05-01 ENCOUNTER — Inpatient Hospital Stay

## 2025-05-08 ENCOUNTER — Inpatient Hospital Stay: Admitting: Nurse Practitioner

## 2025-05-08 ENCOUNTER — Inpatient Hospital Stay
